# Patient Record
Sex: Female | Born: 1940
Health system: Southern US, Community
[De-identification: ages and names within clinical notes are randomized; demographics above are authoritative.]

## PROBLEM LIST (undated history)

## (undated) DIAGNOSIS — H409 Unspecified glaucoma: Secondary | ICD-10-CM

## (undated) DIAGNOSIS — E119 Type 2 diabetes mellitus without complications: Secondary | ICD-10-CM

## (undated) DIAGNOSIS — E039 Hypothyroidism, unspecified: Secondary | ICD-10-CM

## (undated) DIAGNOSIS — E079 Disorder of thyroid, unspecified: Secondary | ICD-10-CM

## (undated) DIAGNOSIS — J40 Bronchitis, not specified as acute or chronic: Secondary | ICD-10-CM

## (undated) DIAGNOSIS — J189 Pneumonia, unspecified organism: Secondary | ICD-10-CM

## (undated) DIAGNOSIS — I00 Rheumatic fever without heart involvement: Secondary | ICD-10-CM

## (undated) DIAGNOSIS — I10 Essential (primary) hypertension: Secondary | ICD-10-CM

## (undated) DIAGNOSIS — E785 Hyperlipidemia, unspecified: Secondary | ICD-10-CM

## (undated) DIAGNOSIS — D649 Anemia, unspecified: Secondary | ICD-10-CM

## (undated) DIAGNOSIS — N183 Chronic kidney disease, stage 3 unspecified: Secondary | ICD-10-CM

## (undated) DIAGNOSIS — E669 Obesity, unspecified: Secondary | ICD-10-CM

## (undated) HISTORY — DX: Essential (primary) hypertension: I10

## (undated) HISTORY — DX: Hyperlipidemia, unspecified: E78.5

## (undated) HISTORY — PX: ANKLE FRACTURE SURGERY: SHX122

## (undated) HISTORY — DX: Obesity, unspecified: E66.9

## (undated) HISTORY — DX: Type 2 diabetes mellitus without complications: E11.9

## (undated) HISTORY — DX: Unspecified glaucoma: H40.9

## (undated) HISTORY — DX: Chronic kidney disease, stage 3 unspecified: N18.30

## (undated) HISTORY — DX: Chronic kidney disease, stage 3 (moderate): N18.3

## (undated) HISTORY — PX: ABDOMINAL HYSTERECTOMY: SHX81

## (undated) HISTORY — DX: Disorder of thyroid, unspecified: E07.9

---

## 2004-12-05 HISTORY — PX: BACK SURGERY: SHX140

## 2005-09-23 ENCOUNTER — Ambulatory Visit (HOSPITAL_COMMUNITY): Admission: RE | Admit: 2005-09-23 | Discharge: 2005-09-23 | Payer: Self-pay | Admitting: Neurosurgery

## 2007-08-24 ENCOUNTER — Ambulatory Visit (HOSPITAL_COMMUNITY): Admission: RE | Admit: 2007-08-24 | Discharge: 2007-08-24 | Payer: Self-pay | Admitting: Family Medicine

## 2007-10-03 ENCOUNTER — Encounter: Admission: RE | Admit: 2007-10-03 | Discharge: 2007-10-03 | Payer: Self-pay | Admitting: Family Medicine

## 2009-02-26 ENCOUNTER — Ambulatory Visit (HOSPITAL_COMMUNITY): Admission: RE | Admit: 2009-02-26 | Discharge: 2009-02-26 | Payer: Self-pay | Admitting: Family Medicine

## 2011-04-22 NOTE — Op Note (Signed)
NAMEFLOELLA, ENSZ NO.:  [id number]   MEDICAL RECORD NO.:  [id number]          PATIENT TYPE:  AMB   LOCATION:  SDS                          FACILITY:  [facility]   PHYSICIAN:  Henry A. Pool, M.D.    DATE OF BIRTH:  [birth year]   DATE OF PROCEDURE:  09/23/2005  DATE OF DISCHARGE:                                 OPERATIVE REPORT   SERVICE:  Neurosurgery.   PREOPERATIVE DIAGNOSES:  Right L2-L3 herniated nucleus pulposus with  radiculopathy.   POSTOPERATIVE DIAGNOSES:  Right L2-L3 herniated nucleus pulposus with  radiculopathy.   PROCEDURE:  Right L2-L3 laminotomy and microdiskectomy.   SURGEON:  Kathaleen Maser. Pool, M.D.   ASSISTANT:  Donalee Citrin, M.D.   ANESTHESIA:  General endotracheal.   INDICATIONS FOR PROCEDURE:  Ms. Alamillo is a 70 year old female with a  history of back and right lower extremity pain consistent with a right sided  L3 radiculopathy.  Workup has demonstrated evidence of a right sided L2-L3  disc herniation with a large inferior fragment causing compression of the  thecal sac and right L3 nerve root.  The patient presents now for right  sided L2-L3 laminotomy and microdiscectomy in hopes of improving her  symptoms.   DESCRIPTION OF PROCEDURE:  The patient was brought to the operating room and  placed on the table in a supine position.  After an adequate level of  anesthesia was achieved, the patient was positioned prone on the Wilson  frame and properly padded.  The area of the lumbar region prepped and draped  sterilely.  A 10 blade was used to make a linear skin incision overlying the  L2-L3 interspace.  This was carried down sharply in the midline.  Subperiosteal dissection was performed exposing the lamina and facet joints  at L2-L3 on the right side.  Deep self-retaining retractors were placed.  X-  ray was taken and, in fact, the L3-L4 level had been exposed.  Dissection  was redirected one level cephalad.  Retractor was replaced.   A  laminotomy was then performed using the high speed drill and Kerrison  rongeurs to remove the inferior aspect of the lamina of L2, medial aspect of  the L2-L3 facet joint, and the superior rim of the L3 lamina.  The  ligamentum flavum was then elevated and resected in piecemeal fashion using  Kerrison rongeurs.  The underlying thecal sac and exiting L3 nerve root were  identified.  The microscope was brought onto the field and microdissection  of the right sided L3 nerve root and underlying disc herniation.  The  epidural venous plexus was coagulated and cut.  The thecal sac and L3 nerve  root were mobilized and retracted toward the midline.  The disc herniation  was readily apparent.  This was incised with a 15 blade.  The fragmented  disc material was then removed using blunt nerve hooks and pituitary  rongeurs.  The disc space was then incised with a 15 blade in rectangular  fashion.  Wide disc space clean out was then achieved using pituitary  rongeurs, upward angled pituitary rongeurs,  and Epstein curets.  After all  elements of disc herniation were completely resected, the canal was  inspected.  There was no evidence of injury to the thecal sac or nerve  roots.  The wound was then copiously irrigated with antibiotic solution,  Gelfoam was placed topically for hemostasis which was found to be good.  The  microscope and retractors were removed.  Hemostasis in  the muscle was achieved with electrocautery.  The wounds were closed in  layers with Vicryl sutures.  Steri-Strips and sterile dressing were applied.  There were no complications.  The patient tolerated the procedure well and  she was returned to the recovery room postoperatively.           ______________________________  Kathaleen Maser Pool, M.D.     HAP/MEDQ  D:  09/23/2005  T:  09/23/2005  Job:  [id number]

## 2011-08-12 ENCOUNTER — Other Ambulatory Visit (HOSPITAL_COMMUNITY): Payer: Self-pay | Admitting: Family Medicine

## 2011-08-12 DIAGNOSIS — Z1231 Encounter for screening mammogram for malignant neoplasm of breast: Secondary | ICD-10-CM

## 2011-08-15 ENCOUNTER — Ambulatory Visit (HOSPITAL_COMMUNITY)
Admission: RE | Admit: 2011-08-15 | Discharge: 2011-08-15 | Disposition: A | Discharge status: Home / Self Care | Payer: Medicare Other | Source: Ambulatory Visit | Attending: Family Medicine | Admitting: Family Medicine

## 2011-08-15 DIAGNOSIS — Z1231 Encounter for screening mammogram for malignant neoplasm of breast: Secondary | ICD-10-CM | POA: Insufficient documentation

## 2012-09-19 ENCOUNTER — Other Ambulatory Visit (HOSPITAL_COMMUNITY): Payer: Self-pay | Admitting: Family Medicine

## 2012-09-19 DIAGNOSIS — Z1231 Encounter for screening mammogram for malignant neoplasm of breast: Secondary | ICD-10-CM

## 2012-09-19 DIAGNOSIS — E559 Vitamin D deficiency, unspecified: Secondary | ICD-10-CM

## 2012-10-17 ENCOUNTER — Ambulatory Visit (HOSPITAL_COMMUNITY): Payer: Medicare Other

## 2012-11-02 ENCOUNTER — Ambulatory Visit (HOSPITAL_COMMUNITY)
Admission: RE | Admit: 2012-11-02 | Discharge: 2012-11-02 | Disposition: A | Discharge status: Home / Self Care | Payer: Medicare Other | Source: Ambulatory Visit | Attending: Family Medicine | Admitting: Family Medicine

## 2012-11-02 DIAGNOSIS — Z1231 Encounter for screening mammogram for malignant neoplasm of breast: Secondary | ICD-10-CM | POA: Insufficient documentation

## 2012-11-02 DIAGNOSIS — E559 Vitamin D deficiency, unspecified: Secondary | ICD-10-CM | POA: Insufficient documentation

## 2012-11-02 DIAGNOSIS — Z13820 Encounter for screening for osteoporosis: Secondary | ICD-10-CM | POA: Insufficient documentation

## 2013-10-14 ENCOUNTER — Other Ambulatory Visit (HOSPITAL_COMMUNITY): Payer: Self-pay | Admitting: Family Medicine

## 2013-10-14 DIAGNOSIS — Z1231 Encounter for screening mammogram for malignant neoplasm of breast: Secondary | ICD-10-CM

## 2013-11-18 ENCOUNTER — Ambulatory Visit (HOSPITAL_COMMUNITY): Payer: Medicare Other

## 2013-11-21 ENCOUNTER — Ambulatory Visit (HOSPITAL_COMMUNITY)
Admission: RE | Admit: 2013-11-21 | Discharge: 2013-11-21 | Disposition: A | Discharge status: Home / Self Care | Payer: Medicare Other | Source: Ambulatory Visit | Attending: Family Medicine | Admitting: Family Medicine

## 2013-11-21 DIAGNOSIS — Z1231 Encounter for screening mammogram for malignant neoplasm of breast: Secondary | ICD-10-CM

## 2013-12-17 ENCOUNTER — Other Ambulatory Visit: Payer: Self-pay | Admitting: Family Medicine

## 2013-12-17 DIAGNOSIS — R6881 Early satiety: Secondary | ICD-10-CM

## 2013-12-23 ENCOUNTER — Other Ambulatory Visit: Payer: Medicare Other

## 2013-12-25 ENCOUNTER — Ambulatory Visit
Admission: RE | Admit: 2013-12-25 | Discharge: 2013-12-25 | Disposition: A | Discharge status: Home / Self Care | Payer: Medicare Other | Source: Ambulatory Visit | Attending: Family Medicine | Admitting: Family Medicine

## 2013-12-25 DIAGNOSIS — R6881 Early satiety: Secondary | ICD-10-CM

## 2013-12-31 DIAGNOSIS — J40 Bronchitis, not specified as acute or chronic: Secondary | ICD-10-CM | POA: Insufficient documentation

## 2013-12-31 DIAGNOSIS — K8020 Calculus of gallbladder without cholecystitis without obstruction: Secondary | ICD-10-CM | POA: Insufficient documentation

## 2014-01-02 ENCOUNTER — Encounter (INDEPENDENT_AMBULATORY_CARE_PROVIDER_SITE_OTHER): Payer: Self-pay | Admitting: Surgery

## 2014-01-10 ENCOUNTER — Ambulatory Visit (INDEPENDENT_AMBULATORY_CARE_PROVIDER_SITE_OTHER): Payer: Medicare Other | Admitting: Surgery

## 2014-01-10 ENCOUNTER — Encounter (INDEPENDENT_AMBULATORY_CARE_PROVIDER_SITE_OTHER): Payer: Self-pay | Admitting: Surgery

## 2014-01-10 VITALS — BP 138/68 | HR 84 | Temp 97.3°F | Resp 15 | Ht 68.0 in | Wt 245.6 lb

## 2014-01-10 DIAGNOSIS — K8010 Calculus of gallbladder with chronic cholecystitis without obstruction: Secondary | ICD-10-CM | POA: Insufficient documentation

## 2014-01-10 NOTE — Progress Notes (Signed)
Patient ID: Claire Blake, female   DOB: 23-Aug-1941, 73 y.o.   MRN: [id number]  Chief Complaint  Patient presents with  . New Evaluation    eval Gallstones    HPI Claire Blake is a 73 y.o. female.  Referred by Dr. Cari Caraway  HPI This is a 73 year old female with hypertension type 2 diabetes hyperlipidemia hypothyroidism obesity and stage III chronic kidney disease that presents with recent development of decreased appetite.  The patient states that, ever since Christmas, she has not had much appetite. When she tries to eat, she gets some mild nausea.  She also reports significant abdominal bloating associated with belching and flatulence. She denies any change in her bowel habits. She does not really have any significant abdominal pain. She did have one episode of generalized abdominal pain after eating.  She presented to Dr. Addison Lank with the symptoms. Her workup included an ultrasound as well as liver function tests and lipase. Her blood work was normal but her ultrasound showed multiple layering gallstones. She is now referred for surgical evaluation for cholecystectomy. Past Medical History  Diagnosis Date  . Diabetes mellitus without complication   . Hypertension   . Hyperlipidemia   . Thyroid disease   . Obesity   . CKD (chronic kidney disease), stage III   . Glaucoma     Past Surgical History  Procedure Laterality Date  . Back surgery  2006    ruptured disc  . Abdominal hysterectomy      partial  . Ankle fracture surgery      Family History  Problem Relation Age of Onset  . COPD Mother   . Cancer Father     throat  . Cancer Maternal Grandmother     bilateral breast    Social History History  Substance Use Topics  . Smoking status: Never Smoker   . Smokeless tobacco: Never Used  . Alcohol Use: No    Allergies  Allergen Reactions  . Zocor [Simvastatin]     Leg cramps    Current Outpatient Prescriptions  Medication Sig Dispense Refill  . aspirin 81  MG tablet Take 81 mg by mouth daily.      . Biotin 300 MCG TABS Take 400 mcg by mouth.      . Blood Glucose Monitoring Suppl (ONE TOUCH ULTRA SYSTEM KIT) W/DEVICE KIT 1 kit by Does not apply route once.      . Calcium 600-200 MG-UNIT per tablet Take 1 tablet by mouth daily.      . cefUROXime (CEFTIN) 500 MG tablet       . Cholecalciferol (VITAMIN D3) 3000 UNITS TABS Take by mouth.      Marland Kitchen glimepiride (AMARYL) 4 MG tablet Take 4 mg by mouth daily with breakfast.      . hydrochlorothiazide (MICROZIDE) 12.5 MG capsule Take 12.5 mg by mouth daily.      Marland Kitchen levothyroxine (SYNTHROID, LEVOTHROID) 88 MCG tablet Take 88 mcg by mouth daily before breakfast.      . lisinopril (PRINIVIL,ZESTRIL) 40 MG tablet Take 40 mg by mouth daily.      Marland Kitchen lovastatin (MEVACOR) 40 MG tablet Take 40 mg by mouth at bedtime.      . metFORMIN (GLUCOPHAGE) 1000 MG tablet Take 1,000 mg by mouth 2 (two) times daily with a meal.      . Multiple Vitamin (MULTIVITAMIN WITH MINERALS) TABS tablet Take 1 tablet by mouth daily.      . pioglitazone (ACTOS) 45 MG tablet  Take 45 mg by mouth daily.       No current facility-administered medications for this visit.    Review of Systems Review of Systems  Constitutional: Negative for fever, chills and unexpected weight change.  HENT: Negative for congestion, hearing loss, sore throat, trouble swallowing and voice change.   Eyes: Negative for visual disturbance.  Respiratory: Negative for cough and wheezing.   Cardiovascular: Negative for chest pain, palpitations and leg swelling.  Gastrointestinal: Positive for nausea and abdominal distention. Negative for vomiting, abdominal pain, diarrhea, constipation, blood in stool and anal bleeding.  Genitourinary: Negative for hematuria, vaginal bleeding and difficulty urinating.  Musculoskeletal: Negative for arthralgias.  Skin: Negative for rash and wound.  Neurological: Negative for seizures, syncope and headaches.  Hematological: Negative for  adenopathy. Does not bruise/bleed easily.  Psychiatric/Behavioral: Negative for confusion.    Blood pressure 138/68, pulse 84, temperature 97.3 F (36.3 C), temperature source Oral, resp. rate 15, height '5\' 8"'  (1.727 m), weight 245 lb 9.6 oz (111.403 kg).  Physical Exam Physical Exam WDWN in NAD HEENT:  EOMI, sclera anicteric Neck:  No masses, no thyromegaly Lungs:  CTA bilaterally; normal respiratory effort CV:  Regular rate and rhythm; no murmurs Abd:  +bowel sounds, soft, non-tender, no masses Ext:  Well-perfused; no edema Skin:  Warm, dry; no sign of jaundice  Data Reviewed US Abdomen Complete  12/25/2013   CLINICAL DATA:  Early satiety.  Diabetes  EXAM: ULTRASOUND ABDOMEN COMPLETE  COMPARISON:  None.  FINDINGS: Gallbladder:  Multiple small layering gallstones without gallbladder wall thickening. Negative sonographic Murphy sign.  Common bile duct:  Diameter: 3.0 mm  Liver:  Echogenic liver without focal lesion.  IVC:  Limited  Pancreas:  Limited  Spleen:  Size and appearance within normal limits.  Right Kidney:  Length: 10.0 cm in length. Echogenicity within normal limits. No mass or hydronephrosis visualized.  Left Kidney:  Length: 12.3 cm in length. Echogenicity within normal limits. No mass or hydronephrosis visualized.  Abdominal aorta:  No aneurysm visualized.  Other findings:  Limited by bowel gas  IMPRESSION: Small layering gallstones without biliary obstruction.  Fatty infiltration of the liver.   Electronically Signed   By: Franchot Gallo M.D.   On: 12/25/2013 11:30    12/17/13 Tbili 0.4 Alk Phos 54 AST 13 ALT 11 Lipase 31   Assessment    Chronic calculus cholecystitis which is likely causing her symptoms.       Plan    Laparoscopic cholecystectomy with intraoperative cholangiogram. The surgical procedure has been discussed with the patient.  Potential risks, benefits, alternative treatments, and expected outcomes have been explained.  All of the patient's  questions at this time have been answered.  The likelihood of reaching the patient's treatment goal is good.  The patient understand the proposed surgical procedure and wishes to proceed.         Paulmichael Schreck K. 01/10/2014, 11:30 AM

## 2014-01-13 NOTE — Pre-Procedure Instructions (Signed)
Remi DeterMaxine M Gionfriddo  01/13/2014   Your procedure is scheduled on:  Thursday, Feb. 12th   [facility]  2 * 3 at  7:00 AM.  Call this number if you have problems the morning of surgery: [phone]   Remember:   Do not eat food or drink liquids after midnight Wednesday.   Take these medicines the morning of surgery with A SIP OF WATER: Levothyroxine   Do not wear jewelry, make-up or nail polish.  Do not wear lotions, powders, or perfumes. You may wear deodorant.  Do not shave underarms & legs 48 hours prior to surgery.   Do not bring valuables to the hospital.  [facility] is not responsible  for any belongings or valuables.               Contacts, dentures or bridgework may not be worn into surgery.  Leave suitcase in the car. After surgery it may be brought to your room.              Patients discharged the day of surgery will not be allowed to drive home.   Name and phone number of your driver:    Special Instructions: [place] - Preparing for Surgery  Before surgery, you can play an important role.  Because skin is not sterile, your skin needs to be as free of germs as possible.  You can reduce the number of germs on you skin by washing with CHG (chlorahexidine gluconate) soap before surgery.  CHG is an antiseptic cleaner which kills germs and bonds with the skin to continue killing germs even after washing.  Please DO NOT use if you have an allergy to CHG or antibacterial soaps.  If your skin becomes reddened/irritated stop using the CHG and inform your nurse when you arrive at Short Stay.  Do not shave (including legs and underarms) for at least 48 hours prior to the first CHG shower.  You may shave your face.  Please follow these instructions carefully:   1.  Shower with CHG Soap the night before surgery and the morning of Surgery.  2.  If you choose to wash your hair, wash your hair first as usual with your normal shampoo.  3.  After you  shampoo, rinse your hair and body thoroughly to remove the Shampoo.  4.  Use CHG as you would any other liquid soap.  You can apply chg directly to the skin and wash gently with scrungie or a clean washcloth.  5.  Apply the CHG Soap to your body ONLY FROM THE NECK DOWN.        Do not use on open wounds or open sores.  Avoid contact with your eyes, ears, mouth and genitals (private parts).  Wash genitals (private parts) with your normal soap.  6.  Wash thoroughly, paying special attention to the area where your surgery will be performed.  7.  Thoroughly rinse your body with warm water from the neck down.  8.  DO NOT shower/wash with your normal soap after using and rinsing off the CHG Soap.  9.  Pat yourself dry with a clean towel.            10.  Wear clean pajamas.            11.  Place clean sheets on your bed the night of your first shower and do not sleep with pets.  Day of Surgery  Do not apply  any lotions/deoderants the morning of surgery.  Please wear clean clothes to the hospital/surgery center.     Please read over the following fact sheets that you were given: Pain Booklet and Surgical Site Infection Prevention

## 2014-01-14 ENCOUNTER — Ambulatory Visit (HOSPITAL_COMMUNITY)
Admission: RE | Admit: 2014-01-14 | Discharge: 2014-01-14 | Disposition: A | Discharge status: Home / Self Care | Payer: Medicare Other | Source: Ambulatory Visit | Attending: Anesthesiology | Admitting: Anesthesiology

## 2014-01-14 ENCOUNTER — Other Ambulatory Visit (INDEPENDENT_AMBULATORY_CARE_PROVIDER_SITE_OTHER): Payer: Self-pay | Admitting: Surgery

## 2014-01-14 ENCOUNTER — Encounter (HOSPITAL_COMMUNITY): Payer: Self-pay

## 2014-01-14 ENCOUNTER — Encounter (HOSPITAL_COMMUNITY)
Admission: RE | Admit: 2014-01-14 | Discharge: 2014-01-14 | Disposition: A | Discharge status: Home / Self Care | Payer: Medicare Other | Source: Ambulatory Visit | Attending: Surgery | Admitting: Surgery

## 2014-01-14 DIAGNOSIS — I10 Essential (primary) hypertension: Secondary | ICD-10-CM | POA: Insufficient documentation

## 2014-01-14 HISTORY — DX: Pneumonia, unspecified organism: J18.9

## 2014-01-14 HISTORY — DX: Bronchitis, not specified as acute or chronic: J40

## 2014-01-14 HISTORY — DX: Anemia, unspecified: D64.9

## 2014-01-14 HISTORY — DX: Hypothyroidism, unspecified: E03.9

## 2014-01-14 HISTORY — DX: Rheumatic fever without heart involvement: I00

## 2014-01-14 LAB — CBC
HCT: 26.2 % — ABNORMAL LOW (ref 36.0–46.0)
Hemoglobin: 8.5 g/dL — ABNORMAL LOW (ref 12.0–15.0)
MCH: 27.7 pg (ref 26.0–34.0)
MCHC: 32.4 g/dL (ref 30.0–36.0)
MCV: 85.3 fL (ref 78.0–100.0)
Platelets: 540 10*3/uL — ABNORMAL HIGH (ref 150–400)
RBC: 3.07 MIL/uL — ABNORMAL LOW (ref 3.87–5.11)
RDW: 14.9 % (ref 11.5–15.5)
WBC: 11.2 10*3/uL — ABNORMAL HIGH (ref 4.0–10.5)

## 2014-01-14 LAB — BASIC METABOLIC PANEL
BUN: 41 mg/dL — AB (ref 6–23)
CALCIUM: 9.6 mg/dL (ref 8.4–10.5)
CO2: 25 mEq/L (ref 19–32)
CREATININE: 1.21 mg/dL — AB (ref 0.50–1.10)
Chloride: 92 mEq/L — ABNORMAL LOW (ref 96–112)
GFR calc Af Amer: 51 mL/min — ABNORMAL LOW (ref >90)
GFR, EST NON AFRICAN AMERICAN: 44 mL/min — AB (ref >90)
GLUCOSE: 175 mg/dL — AB (ref 70–99)
Potassium: 4.7 mEq/L (ref 3.7–5.3)
Sodium: 133 mEq/L — ABNORMAL LOW (ref 137–147)

## 2014-01-14 MED ORDER — ONDANSETRON 4 MG PO TBDP: 4.0000 mg | ORAL_TABLET | Freq: Three times a day (TID) | ORAL | Status: DC | PRN

## 2014-01-14 NOTE — Pre-Procedure Instructions (Addendum)
Remi DeterMaxine M Creveling  01/14/2014   Your procedure is scheduled on:  01/16/14  [facility] at 700 AM.  Call this number if you have problems the morning of surgery: [phone]   Remember:   Do not eat food or drink liquids after midnight.   Take these medicines the morning of surgery with A SIP OF WATER: levothyroxine       STOP all herbel meds, nsaids (aleve,naproxen,advil,ibuprofen) now including aspirin, biotin, vitamins        NO DIABETIC MEDS AM OF SURGERY   Do not wear jewelry, make-up or nail polish.  Do not wear lotions, powders, or perfumes. You may wear deodorant.  Do not shave 48 hours prior to surgery. Men may shave face and neck.  Do not bring valuables to the hospital.  [facility] is not responsible                  for any belongings or valuables.               Contacts, dentures or bridgework may not be worn into surgery.  Leave suitcase in the car. After surgery it may be brought to your room.  For patients admitted to the hospital, discharge time is determined by your                treatment team.               Patients discharged the day of surgery will not be allowed to drive  home.  Name and phone number of your driver:  Special Instructions:  Special Instructions: [place] - Preparing for Surgery  Before surgery, you can play an important role.  Because skin is not sterile, your skin needs to be as free of germs as possible.  You can reduce the number of germs on you skin by washing with CHG (chlorahexidine gluconate) soap before surgery.  CHG is an antiseptic cleaner which kills germs and bonds with the skin to continue killing germs even after washing.  Please DO NOT use if you have an allergy to CHG or antibacterial soaps.  If your skin becomes reddened/irritated stop using the CHG and inform your nurse when you arrive at Short Stay.  Do not shave (including legs and underarms) for at least 48 hours prior to the first CHG shower.   You may shave your face.  Please follow these instructions carefully:   1.  Shower with CHG Soap the night before surgery and the morning of Surgery.  2.  If you choose to wash your hair, wash your hair first as usual with your normal shampoo.  3.  After you shampoo, rinse your hair and body thoroughly to remove the Shampoo.  4.  Use CHG as you would any other liquid soap.  You can apply chg directly  to the skin and wash gently with scrungie or a clean washcloth.  5.  Apply the CHG Soap to your body ONLY FROM THE NECK DOWN.  Do not use on open wounds or open sores.  Avoid contact with your eyes ears, mouth and genitals (private parts).  Wash genitals (private parts)       with your normal soap.  6.  Wash thoroughly, paying special attention to the area where your surgery will be performed.  7.  Thoroughly rinse your body with warm water from the neck down.  8.  DO NOT shower/wash with your normal soap after using  and rinsing off the CHG Soap.  9.  Pat yourself dry with a clean towel.            10.  Wear clean pajamas.            11.  Place clean sheets on your bed the night of your first shower and do not sleep with pets.  Day of Surgery  Do not apply any lotions/deodorants the morning of surgery.  Please wear clean clothes to the hospital/surgery center.   Please read over the following fact sheets that you were given: Pain Booklet, Coughing and Deep Breathing and Surgical Site Infection Prevention

## 2014-01-14 NOTE — Progress Notes (Addendum)
Anesthesia Chart Review:  Patient is a 73 year old female scheduled for laparoscopic cholecystectomy on 01/16/14 by Dr. Corliss Skainssuei.  History includes non-smoker, DM2, HTN, HLD, CKD stage III, hypothyroidism, glaucoma, Rheumatic fever, anemia, hysterectomy, L2-3 microdiskectomy '12.  BMI is 36.82 (obesity). PCP is Dr. Selena BattenWendy McNeil.  EKG on 01/14/14 showed NSR, low voltage QRS, cannot rule out anterior infarct (age undetermined). Isolated Q wave in lead III is new, otherwise I think her EKG is stable when compared to prior EKG on 09/19/05 (see Muse).  CXR on 01/14/14 showed no active cardiopulmonary disease.  Preoperative labs showed BUN/Cr 41/1.21.  WBC 11.2.  H/H 8.5/26.2, PLT 540K. Labs are already marked as reviewed by Dr. Corliss Skainssuei in Epic. I will defer decision for T&S/T&C to the surgeon.  Records with comparison labs have also been requested from her PCP, but these are still pending.  Claire Ochsllison [name], PA-C [facility]/Anesthesiology Phone [phone] 01/15/2014 10:32 AM  Addendum: 01/16/2014 3:40 PM I reviewed records from Dr. Uvaldo RisingMcNeil.  She was last seen 12/31/13.  Her last CBC was on 06/20/13 and H/H were 12.1/36.0 then.  According to Dr. Fatima Sangersuei's note from last week, she denied any blood in stool or anal bleeding. I did send Dr. Corliss Skainssuei a staff message with prior lab results for his review so he can instruct patient regarding further anemia follow-up.

## 2014-01-14 NOTE — Progress Notes (Signed)
01/14/14 1457  OBSTRUCTIVE SLEEP APNEA  Have you ever been diagnosed with sleep apnea through a sleep study? No  Do you snore loudly (loud enough to be heard through closed doors)?  0  Do you often feel tired, fatigued, or sleepy during the daytime? 0  Has anyone observed you stop breathing during your sleep? 0  Do you have, or are you being treated for high blood pressure? 1  BMI more than 35 kg/m2? 1  Age over 73 years old? 1  Neck circumference greater than 40 cm/18 inches? 1 (19)  Gender: 0  Obstructive Sleep Apnea Score 4  Score 4 or greater  Results sent to PCP

## 2014-01-14 NOTE — Progress Notes (Signed)
Patient extremely nauseated for past few days. Unable to eat. Called dr Corliss Skainstsuei office to call in something for nausea. He stated he would call her something in to her pharmacy.

## 2014-01-14 NOTE — Progress Notes (Signed)
req'd ekg, office notes [facility] [phone]

## 2014-01-15 MED ORDER — CEFAZOLIN SODIUM-DEXTROSE 2-3 GM-% IV SOLR
2.0000 g | INTRAVENOUS | Status: AC
  Administered 2014-01-16: 2 g via INTRAVENOUS
  Filled 2014-01-15: qty 50

## 2014-01-15 MED ORDER — CHLORHEXIDINE GLUCONATE 4 % EX LIQD
1.0000 "application " | Freq: Once | CUTANEOUS | Status: DC
  Filled 2014-01-15: qty 15

## 2014-01-16 ENCOUNTER — Encounter (HOSPITAL_COMMUNITY)
Admission: RE | Disposition: A | Discharge status: Home / Self Care | Payer: Self-pay | Source: Ambulatory Visit | Attending: Surgery

## 2014-01-16 ENCOUNTER — Ambulatory Visit (HOSPITAL_COMMUNITY): Payer: Medicare Other | Admitting: Anesthesiology

## 2014-01-16 ENCOUNTER — Inpatient Hospital Stay (HOSPITAL_COMMUNITY)
Admission: RE | Admit: 2014-01-16 | Discharge: 2014-01-18 | DRG: 417 | Disposition: A | Discharge status: Home / Self Care | Payer: Medicare Other | Source: Ambulatory Visit | Attending: Surgery | Admitting: Surgery

## 2014-01-16 ENCOUNTER — Ambulatory Visit (HOSPITAL_COMMUNITY): Payer: Medicare Other

## 2014-01-16 ENCOUNTER — Encounter (HOSPITAL_COMMUNITY): Payer: Self-pay | Admitting: *Deleted

## 2014-01-16 ENCOUNTER — Encounter (HOSPITAL_COMMUNITY): Payer: Medicare Other | Admitting: Vascular Surgery

## 2014-01-16 DIAGNOSIS — N183 Chronic kidney disease, stage 3 unspecified: Secondary | ICD-10-CM | POA: Diagnosis present

## 2014-01-16 DIAGNOSIS — K8010 Calculus of gallbladder with chronic cholecystitis without obstruction: Secondary | ICD-10-CM

## 2014-01-16 DIAGNOSIS — Z7982 Long term (current) use of aspirin: Secondary | ICD-10-CM

## 2014-01-16 DIAGNOSIS — Y849 Medical procedure, unspecified as the cause of abnormal reaction of the patient, or of later complication, without mention of misadventure at the time of the procedure: Secondary | ICD-10-CM | POA: Diagnosis not present

## 2014-01-16 DIAGNOSIS — E039 Hypothyroidism, unspecified: Secondary | ICD-10-CM | POA: Diagnosis present

## 2014-01-16 DIAGNOSIS — E785 Hyperlipidemia, unspecified: Secondary | ICD-10-CM | POA: Diagnosis present

## 2014-01-16 DIAGNOSIS — IMO0002 Reserved for concepts with insufficient information to code with codable children: Secondary | ICD-10-CM | POA: Diagnosis not present

## 2014-01-16 DIAGNOSIS — E669 Obesity, unspecified: Secondary | ICD-10-CM | POA: Diagnosis present

## 2014-01-16 DIAGNOSIS — K226 Gastro-esophageal laceration-hemorrhage syndrome: Secondary | ICD-10-CM | POA: Diagnosis not present

## 2014-01-16 DIAGNOSIS — D649 Anemia, unspecified: Secondary | ICD-10-CM | POA: Diagnosis present

## 2014-01-16 DIAGNOSIS — E1169 Type 2 diabetes mellitus with other specified complication: Secondary | ICD-10-CM | POA: Diagnosis present

## 2014-01-16 DIAGNOSIS — K811 Chronic cholecystitis: Secondary | ICD-10-CM | POA: Diagnosis present

## 2014-01-16 DIAGNOSIS — I129 Hypertensive chronic kidney disease with stage 1 through stage 4 chronic kidney disease, or unspecified chronic kidney disease: Secondary | ICD-10-CM | POA: Diagnosis present

## 2014-01-16 HISTORY — PX: ESOPHAGOGASTRODUODENOSCOPY: SHX5428

## 2014-01-16 HISTORY — PX: CHOLECYSTECTOMY: SHX55

## 2014-01-16 LAB — GLUCOSE, CAPILLARY
GLUCOSE-CAPILLARY: 216 mg/dL — AB (ref 70–99)
GLUCOSE-CAPILLARY: 217 mg/dL — AB (ref 70–99)
Glucose-Capillary: 217 mg/dL — ABNORMAL HIGH (ref 70–99)
Glucose-Capillary: 147 mg/dL — ABNORMAL HIGH (ref 70–99)
Glucose-Capillary: 120 mg/dL — ABNORMAL HIGH (ref 70–99)

## 2014-01-16 SURGERY — EGD (ESOPHAGOGASTRODUODENOSCOPY)
Anesthesia: Moderate Sedation

## 2014-01-16 SURGERY — LAPAROSCOPIC CHOLECYSTECTOMY WITH INTRAOPERATIVE CHOLANGIOGRAM
Anesthesia: General | Site: Abdomen

## 2014-01-16 MED ORDER — LISINOPRIL 40 MG PO TABS
40.0000 mg | ORAL_TABLET | Freq: Every day | ORAL | Status: DC
  Administered 2014-01-16 – 2014-01-18 (×2): 40 mg via ORAL
  Filled 2014-01-16 (×3): qty 1

## 2014-01-16 MED ORDER — FENTANYL CITRATE 0.05 MG/ML IJ SOLN
INTRAMUSCULAR | Status: AC
  Filled 2014-01-16: qty 5

## 2014-01-16 MED ORDER — PHENYLEPHRINE 40 MCG/ML (10ML) SYRINGE FOR IV PUSH (FOR BLOOD PRESSURE SUPPORT)
PREFILLED_SYRINGE | INTRAVENOUS | Status: AC
  Filled 2014-01-16: qty 10

## 2014-01-16 MED ORDER — INSULIN ASPART 100 UNIT/ML ~~LOC~~ SOLN
0.0000 [IU] | SUBCUTANEOUS | Status: DC
  Administered 2014-01-16: 5 [IU] via SUBCUTANEOUS
  Administered 2014-01-16: 2 [IU] via SUBCUTANEOUS

## 2014-01-16 MED ORDER — MIDAZOLAM HCL 5 MG/ML IJ SOLN
INTRAMUSCULAR | Status: AC
  Filled 2014-01-16: qty 2

## 2014-01-16 MED ORDER — OXYCODONE-ACETAMINOPHEN 5-325 MG PO TABS
1.0000 | ORAL_TABLET | ORAL | Status: DC | PRN
  Administered 2014-01-17 (×3): 1 via ORAL
  Filled 2014-01-16 (×3): qty 1

## 2014-01-16 MED ORDER — PHENYLEPHRINE HCL 10 MG/ML IJ SOLN
INTRAMUSCULAR | Status: DC | PRN
  Administered 2014-01-16: 80 ug via INTRAVENOUS
  Administered 2014-01-16 (×2): 160 ug via INTRAVENOUS

## 2014-01-16 MED ORDER — EPHEDRINE SULFATE 50 MG/ML IJ SOLN
INTRAMUSCULAR | Status: AC
  Filled 2014-01-16: qty 1

## 2014-01-16 MED ORDER — ONDANSETRON HCL 4 MG PO TABS: 4.0000 mg | ORAL_TABLET | Freq: Four times a day (QID) | ORAL | Status: DC | PRN

## 2014-01-16 MED ORDER — ONDANSETRON HCL 4 MG/2ML IJ SOLN
INTRAMUSCULAR | Status: AC
  Filled 2014-01-16: qty 2

## 2014-01-16 MED ORDER — NEOSTIGMINE METHYLSULFATE 1 MG/ML IJ SOLN
INTRAMUSCULAR | Status: AC
  Filled 2014-01-16: qty 10

## 2014-01-16 MED ORDER — LIDOCAINE HCL (CARDIAC) 20 MG/ML IV SOLN
INTRAVENOUS | Status: DC | PRN
  Administered 2014-01-16: 80 mg via INTRAVENOUS

## 2014-01-16 MED ORDER — LACTATED RINGERS IV SOLN
INTRAVENOUS | Status: DC | PRN
  Administered 2014-01-16: 09:00:00 via INTRAVENOUS

## 2014-01-16 MED ORDER — NEOSTIGMINE METHYLSULFATE 1 MG/ML IJ SOLN
INTRAMUSCULAR | Status: DC | PRN
  Administered 2014-01-16: 4 mg via INTRAVENOUS
  Administered 2014-01-16: 1 mg via INTRAVENOUS

## 2014-01-16 MED ORDER — DIPHENHYDRAMINE HCL 50 MG/ML IJ SOLN
INTRAMUSCULAR | Status: AC
  Filled 2014-01-16: qty 1

## 2014-01-16 MED ORDER — PROPOFOL 10 MG/ML IV BOLUS
INTRAVENOUS | Status: AC
  Filled 2014-01-16: qty 20

## 2014-01-16 MED ORDER — ONDANSETRON HCL 4 MG/2ML IJ SOLN: 4.0000 mg | Freq: Four times a day (QID) | INTRAMUSCULAR | Status: DC | PRN

## 2014-01-16 MED ORDER — SUCCINYLCHOLINE CHLORIDE 20 MG/ML IJ SOLN
INTRAMUSCULAR | Status: AC
  Filled 2014-01-16: qty 1

## 2014-01-16 MED ORDER — OXYCODONE HCL 5 MG/5ML PO SOLN: 5.0000 mg | Freq: Once | ORAL | Status: DC | PRN

## 2014-01-16 MED ORDER — 0.9 % SODIUM CHLORIDE (POUR BTL) OPTIME
TOPICAL | Status: DC | PRN
  Administered 2014-01-16: 1000 mL

## 2014-01-16 MED ORDER — FENTANYL CITRATE 0.05 MG/ML IJ SOLN
INTRAMUSCULAR | Status: DC | PRN
  Administered 2014-01-16: 100 ug via INTRAVENOUS

## 2014-01-16 MED ORDER — ALBUMIN HUMAN 5 % IV SOLN
INTRAVENOUS | Status: DC | PRN
  Administered 2014-01-16: 09:00:00 via INTRAVENOUS

## 2014-01-16 MED ORDER — MIDAZOLAM HCL 10 MG/2ML IJ SOLN
INTRAMUSCULAR | Status: DC | PRN
  Administered 2014-01-16 (×3): 1 mg via INTRAVENOUS
  Administered 2014-01-16: 2 mg via INTRAVENOUS
  Administered 2014-01-16: 1 mg via INTRAVENOUS

## 2014-01-16 MED ORDER — FENTANYL CITRATE 0.05 MG/ML IJ SOLN
INTRAMUSCULAR | Status: AC
  Filled 2014-01-16: qty 2

## 2014-01-16 MED ORDER — BUPIVACAINE-EPINEPHRINE (PF) 0.25% -1:200000 IJ SOLN
INTRAMUSCULAR | Status: AC
  Filled 2014-01-16: qty 30

## 2014-01-16 MED ORDER — ONDANSETRON HCL 4 MG/2ML IJ SOLN
INTRAMUSCULAR | Status: DC | PRN
Start: 2014-01-16 — End: 2014-01-16
  Administered 2014-01-16: 4 mg via INTRAVENOUS

## 2014-01-16 MED ORDER — INSULIN ASPART 100 UNIT/ML ~~LOC~~ SOLN
4.0000 [IU] | Freq: Three times a day (TID) | SUBCUTANEOUS | Status: DC
Start: 2014-01-17 — End: 2014-01-18
  Administered 2014-01-17 – 2014-01-18 (×3): 4 [IU] via SUBCUTANEOUS

## 2014-01-16 MED ORDER — GLYCOPYRROLATE 0.2 MG/ML IJ SOLN
INTRAMUSCULAR | Status: AC
  Filled 2014-01-16: qty 3

## 2014-01-16 MED ORDER — FENTANYL CITRATE 0.05 MG/ML IJ SOLN: 25.0000 ug | INTRAMUSCULAR | Status: DC | PRN

## 2014-01-16 MED ORDER — PROPOFOL 10 MG/ML IV BOLUS
INTRAVENOUS | Status: DC | PRN
  Administered 2014-01-16: 200 mg via INTRAVENOUS

## 2014-01-16 MED ORDER — PANTOPRAZOLE SODIUM 40 MG IV SOLR
40.0000 mg | Freq: Two times a day (BID) | INTRAVENOUS | Status: DC
  Administered 2014-01-16 – 2014-01-18 (×4): 40 mg via INTRAVENOUS
  Filled 2014-01-16 (×6): qty 40

## 2014-01-16 MED ORDER — SODIUM CHLORIDE 0.9 % IV SOLN
INTRAVENOUS | Status: DC | PRN
  Administered 2014-01-16: 09:00:00

## 2014-01-16 MED ORDER — ROCURONIUM BROMIDE 50 MG/5ML IV SOLN
INTRAVENOUS | Status: AC
  Filled 2014-01-16: qty 1

## 2014-01-16 MED ORDER — MORPHINE SULFATE 2 MG/ML IJ SOLN
2.0000 mg | INTRAMUSCULAR | Status: DC | PRN
  Administered 2014-01-16 (×2): 4 mg via INTRAVENOUS
  Administered 2014-01-17: 2 mg via INTRAVENOUS
  Filled 2014-01-16: qty 1
  Filled 2014-01-16 (×2): qty 2

## 2014-01-16 MED ORDER — SODIUM CHLORIDE 0.9 % IV SOLN
INTRAVENOUS | Status: DC
  Administered 2014-01-16 – 2014-01-17 (×3): via INTRAVENOUS

## 2014-01-16 MED ORDER — LIDOCAINE HCL (CARDIAC) 20 MG/ML IV SOLN
INTRAVENOUS | Status: AC
  Filled 2014-01-16: qty 5

## 2014-01-16 MED ORDER — OXYCODONE HCL 5 MG PO TABS: 5.0000 mg | ORAL_TABLET | Freq: Once | ORAL | Status: DC | PRN

## 2014-01-16 MED ORDER — HYDROCHLOROTHIAZIDE 12.5 MG PO CAPS
12.5000 mg | ORAL_CAPSULE | Freq: Every day | ORAL | Status: DC
  Administered 2014-01-16 – 2014-01-18 (×2): 12.5 mg via ORAL
  Filled 2014-01-16 (×3): qty 1

## 2014-01-16 MED ORDER — SODIUM CHLORIDE 0.9 % IV SOLN: INTRAVENOUS | Status: DC

## 2014-01-16 MED ORDER — SODIUM CHLORIDE 0.9 % IR SOLN
Status: DC | PRN
  Administered 2014-01-16: 1000 mL

## 2014-01-16 MED ORDER — SODIUM CHLORIDE 0.9 % IV SOLN
INTRAVENOUS | Status: DC
  Administered 2014-01-16: 08:00:00 via INTRAVENOUS

## 2014-01-16 MED ORDER — SODIUM CHLORIDE 0.9 % IJ SOLN
INTRAMUSCULAR | Status: AC
  Filled 2014-01-16: qty 10

## 2014-01-16 MED ORDER — SUCCINYLCHOLINE CHLORIDE 20 MG/ML IJ SOLN
INTRAMUSCULAR | Status: DC | PRN
  Administered 2014-01-16: 100 mg via INTRAVENOUS

## 2014-01-16 MED ORDER — LEVOTHYROXINE SODIUM 88 MCG PO TABS
88.0000 ug | ORAL_TABLET | Freq: Every day | ORAL | Status: DC
  Administered 2014-01-16 – 2014-01-18 (×3): 88 ug via ORAL
  Filled 2014-01-16 (×4): qty 1

## 2014-01-16 MED ORDER — ROCURONIUM BROMIDE 100 MG/10ML IV SOLN
INTRAVENOUS | Status: DC | PRN
  Administered 2014-01-16: 40 mg via INTRAVENOUS

## 2014-01-16 MED ORDER — INSULIN ASPART 100 UNIT/ML ~~LOC~~ SOLN
0.0000 [IU] | Freq: Three times a day (TID) | SUBCUTANEOUS | Status: DC
  Administered 2014-01-17 – 2014-01-18 (×3): 3 [IU] via SUBCUTANEOUS

## 2014-01-16 MED ORDER — BUPIVACAINE-EPINEPHRINE 0.25% -1:200000 IJ SOLN
INTRAMUSCULAR | Status: DC | PRN
  Administered 2014-01-16: 14 mL

## 2014-01-16 MED ORDER — GLYCOPYRROLATE 0.2 MG/ML IJ SOLN
INTRAMUSCULAR | Status: DC | PRN
  Administered 2014-01-16: 0.6 mg via INTRAVENOUS

## 2014-01-16 MED ORDER — FENTANYL CITRATE 0.05 MG/ML IJ SOLN
INTRAMUSCULAR | Status: DC | PRN
  Administered 2014-01-16: 25 ug via INTRAVENOUS

## 2014-01-16 SURGICAL SUPPLY — 50 items
APL SKNCLS STERI-STRIP NONHPOA (GAUZE/BANDAGES/DRESSINGS) ×1
APPLIER CLIP ROT 10 11.4 M/L (STAPLE) ×3
APR CLP MED LRG 11.4X10 (STAPLE) ×1
BAG SPEC RTRVL LRG 6X4 10 (ENDOMECHANICALS) ×1
BENZOIN TINCTURE PRP APPL 2/3 (GAUZE/BANDAGES/DRESSINGS) ×3 IMPLANT
BLADE SURG ROTATE 9660 (MISCELLANEOUS) IMPLANT
CANISTER SUCTION 2500CC (MISCELLANEOUS) ×3 IMPLANT
CHLORAPREP W/TINT 26ML (MISCELLANEOUS) ×3 IMPLANT
CLIP APPLIE ROT 10 11.4 M/L (STAPLE) ×1 IMPLANT
COVER MAYO STAND STRL (DRAPES) ×3 IMPLANT
COVER SURGICAL LIGHT HANDLE (MISCELLANEOUS) ×3 IMPLANT
DRAPE C-ARM 42X72 X-RAY (DRAPES) ×3 IMPLANT
DRAPE UTILITY 15X26 W/TAPE STR (DRAPE) ×6 IMPLANT
DRSG TEGADERM 2-3/8X2-3/4 SM (GAUZE/BANDAGES/DRESSINGS) ×9 IMPLANT
DRSG TEGADERM 4X4.75 (GAUZE/BANDAGES/DRESSINGS) ×3 IMPLANT
ELECT REM PT RETURN 9FT ADLT (ELECTROSURGICAL) ×3
ELECTRODE REM PT RTRN 9FT ADLT (ELECTROSURGICAL) ×1 IMPLANT
FILTER SMOKE EVAC LAPAROSHD (FILTER) ×3 IMPLANT
GAUZE SPONGE 2X2 8PLY STRL LF (GAUZE/BANDAGES/DRESSINGS) ×1 IMPLANT
GLOVE BIO SURGEON STRL SZ 6.5 (GLOVE) ×1 IMPLANT
GLOVE BIO SURGEON STRL SZ7 (GLOVE) ×5 IMPLANT
GLOVE BIO SURGEONS STRL SZ 6.5 (GLOVE) ×1
GLOVE BIOGEL PI IND STRL 6.5 (GLOVE) ×2 IMPLANT
GLOVE BIOGEL PI IND STRL 7.0 (GLOVE) IMPLANT
GLOVE BIOGEL PI IND STRL 7.5 (GLOVE) ×1 IMPLANT
GLOVE BIOGEL PI INDICATOR 6.5 (GLOVE) ×4
GLOVE BIOGEL PI INDICATOR 7.0 (GLOVE) ×2
GLOVE BIOGEL PI INDICATOR 7.5 (GLOVE) ×2
GLOVE SURG SS PI 6.5 STRL IVOR (GLOVE) ×2 IMPLANT
GOWN STRL NON-REIN LRG LVL3 (GOWN DISPOSABLE) IMPLANT
GOWN STRL REUS W/ TWL LRG LVL3 (GOWN DISPOSABLE) ×5 IMPLANT
GOWN STRL REUS W/TWL LRG LVL3 (GOWN DISPOSABLE) ×15
KIT BASIN OR (CUSTOM PROCEDURE TRAY) ×3 IMPLANT
KIT ROOM TURNOVER OR (KITS) ×3 IMPLANT
NS IRRIG 1000ML POUR BTL (IV SOLUTION) ×3 IMPLANT
PAD ARMBOARD 7.5X6 YLW CONV (MISCELLANEOUS) ×3 IMPLANT
POUCH SPECIMEN RETRIEVAL 10MM (ENDOMECHANICALS) ×3 IMPLANT
SCISSORS LAP 5X35 DISP (ENDOMECHANICALS) ×3 IMPLANT
SET CHOLANGIOGRAPH 5 50 .035 (SET/KITS/TRAYS/PACK) ×3 IMPLANT
SET IRRIG TUBING LAPAROSCOPIC (IRRIGATION / IRRIGATOR) ×3 IMPLANT
SLEEVE ENDOPATH XCEL 5M (ENDOMECHANICALS) ×3 IMPLANT
SPECIMEN JAR SMALL (MISCELLANEOUS) ×3 IMPLANT
SPONGE GAUZE 2X2 STER 10/PKG (GAUZE/BANDAGES/DRESSINGS) ×2
SUT MNCRL AB 4-0 PS2 18 (SUTURE) ×3 IMPLANT
TOWEL OR 17X24 6PK STRL BLUE (TOWEL DISPOSABLE) ×3 IMPLANT
TOWEL OR 17X26 10 PK STRL BLUE (TOWEL DISPOSABLE) ×3 IMPLANT
TRAY LAPAROSCOPIC (CUSTOM PROCEDURE TRAY) ×3 IMPLANT
TROCAR XCEL BLUNT TIP 100MML (ENDOMECHANICALS) ×3 IMPLANT
TROCAR XCEL NON-BLD 11X100MML (ENDOMECHANICALS) ×3 IMPLANT
TROCAR XCEL NON-BLD 5MMX100MML (ENDOMECHANICALS) ×3 IMPLANT

## 2014-01-16 NOTE — Interval H&P Note (Signed)
History and Physical Interval Note:  01/16/2014 7:36 AM  Claire Blake  has presented today for surgery, with the diagnosis of Chronic calculus cholecystitis  The various methods of treatment have been discussed with the patient and family. After consideration of risks, benefits and other options for treatment, the patient has consented to  Procedure(s): LAPAROSCOPIC CHOLECYSTECTOMY WITH INTRAOPERATIVE CHOLANGIOGRAM (N/A) as a surgical intervention .  The patient's history has been reviewed, patient examined, no change in status, stable for surgery.  I have reviewed the patient's chart and labs.  Questions were answered to the patient's satisfaction.     Claire Blake K.

## 2014-01-16 NOTE — Transfer of Care (Signed)
Immediate Anesthesia Transfer of Care Note  Patient: Claire Blake  Procedure(s) Performed: Procedure(s): LAPAROSCOPIC CHOLECYSTECTOMY WITH INTRAOPERATIVE CHOLANGIOGRAM (N/A)  Patient Location: PACU  Anesthesia Type:General  Level of Consciousness: awake, alert , oriented and patient cooperative  Airway & Oxygen Therapy: Patient Spontanous Breathing and Patient connected to face mask oxygen  Post-op Assessment: Report given to PACU RN, Post -op Vital signs reviewed and stable and Patient moving all extremities  Post vital signs: Reviewed and stable  Complications: No apparent anesthesia complications

## 2014-01-16 NOTE — Consult Note (Signed)
Referring Provider: Dr. Verita Lamb Primary Care Physician:  Cari Caraway, MD Primary Gastroenterologist:  Dr. Teena Irani  Reason for Consultation:  Upper GI bleed  HPI: Claire Blake is a 73 y.o. female who underwent an uncomplicated laparoscopic cholecystectomy today, but while on the operating table, when the oral gastric tube was passed, approximately 200 cc of fresh blood were noted in the stomach. There was no ongoing bleeding or hemodynamic instability. The patient has a several month history of anorexia and takes a daily baby aspirin, but she has no prior history of ulcer disease or GI bleeding. She has not had endoscopic evaluation in the past. She is on a daily 81 mg aspirin without PPI coverage. There has been a decline in her hemoglobin from 12 to 8.5 over the past 2 months, and her BUN when checked on admission was somewhat elevated.   Past Medical History  Diagnosis Date  . Diabetes mellitus without complication   . Hypertension   . Hyperlipidemia   . Thyroid disease   . Obesity   . CKD (chronic kidney disease), stage III   . Glaucoma   . Rheumatic fever     73 yrs old  . Hypothyroidism   . Bronchitis     hx  . Pneumonia     hx  . Anemia     hx    Past Surgical History  Procedure Laterality Date  . Back surgery  2006    ruptured disc  . Abdominal hysterectomy      partial  . Ankle fracture surgery      Prior to Admission medications   Medication Sig Start Date End Date Taking? Authorizing Provider  aspirin 81 MG tablet Take 81 mg by mouth daily.   Yes Historical Provider, MD  Biotin 300 MCG TABS Take 300 mcg by mouth daily.    Yes Historical Provider, MD  Blood Glucose Monitoring Suppl (ONE TOUCH ULTRA SYSTEM KIT) W/DEVICE KIT 1 kit by Does not apply route once.   Yes Historical Provider, MD  Calcium 600-200 MG-UNIT per tablet Take 1 tablet by mouth daily.   Yes Historical Provider, MD  Cholecalciferol (VITAMIN D3) 3000 UNITS TABS Take 3,000 Units by mouth  daily.    Yes Historical Provider, MD  glimepiride (AMARYL) 4 MG tablet Take 4 mg by mouth daily with breakfast.   Yes Historical Provider, MD  hydrochlorothiazide (MICROZIDE) 12.5 MG capsule Take 12.5 mg by mouth daily.   Yes Historical Provider, MD  levothyroxine (SYNTHROID, LEVOTHROID) 88 MCG tablet Take 88 mcg by mouth daily before breakfast.   Yes Historical Provider, MD  lisinopril (PRINIVIL,ZESTRIL) 40 MG tablet Take 40 mg by mouth daily.   Yes Historical Provider, MD  lovastatin (MEVACOR) 40 MG tablet Take 40 mg by mouth at bedtime.   Yes Historical Provider, MD  metFORMIN (GLUCOPHAGE) 1000 MG tablet Take 1,000 mg by mouth 2 (two) times daily with a meal.   Yes Historical Provider, MD  Multiple Vitamin (MULTIVITAMIN WITH MINERALS) TABS tablet Take 1 tablet by mouth daily.   Yes Historical Provider, MD  ondansetron (ZOFRAN ODT) 4 MG disintegrating tablet Take 1 tablet (4 mg total) by mouth every 8 (eight) hours as needed for nausea or vomiting. 01/14/14  Yes Imogene Burn. Tsuei, MD  pioglitazone (ACTOS) 45 MG tablet Take 45 mg by mouth daily.   Yes Historical Provider, MD  cefUROXime (CEFTIN) 500 MG tablet  12/31/13   Historical Provider, MD    Current Facility-Administered Medications  Medication  Dose Route Frequency Provider Last Rate Last Dose  . 0.9 %  sodium chloride infusion   Intravenous Continuous Imogene Burn. Tsuei, MD      . 0.9 %  sodium chloride infusion   Intravenous Continuous Cleotis Nipper, MD      . Doug Sou HOLD] hydrochlorothiazide (MICROZIDE) capsule 12.5 mg  12.5 mg Oral Daily Imogene Burn. Tsuei, MD   12.5 mg at 01/16/14 1330  . [MAR HOLD] insulin aspart (novoLOG) injection 0-15 Units  0-15 Units Subcutaneous 6 times per day Imogene Burn. Tsuei, MD   5 Units at 01/16/14 1328  . [MAR HOLD] levothyroxine (SYNTHROID, LEVOTHROID) tablet 88 mcg  88 mcg Oral QAC breakfast Imogene Burn. Tsuei, MD   88 mcg at 01/16/14 1326  . [MAR HOLD] lisinopril (PRINIVIL,ZESTRIL) tablet 40 mg  40 mg Oral  Daily Imogene Burn. Tsuei, MD   40 mg at 01/16/14 1326  . [facility] HOLD] morphine 2 MG/ML injection 2-4 mg  2-4 mg Intravenous Q2H PRN Imogene Burn. Tsuei, MD   4 mg at 01/16/14 1323  . [MAR HOLD] ondansetron (ZOFRAN) tablet 4 mg  4 mg Oral Q6H PRN Imogene Burn. Tsuei, MD       Or  . Doug Sou HOLD] ondansetron (ZOFRAN) injection 4 mg  4 mg Intravenous Q6H PRN Imogene Burn. Tsuei, MD        Allergies as of 01/10/2014 - Review Complete 01/10/2014  Allergen Reaction Noted  . Zocor [simvastatin]  01/02/2014    Family History  Problem Relation Age of Onset  . COPD Mother   . Cancer Father     throat  . Cancer Maternal Grandmother     bilateral breast    History   Social History  . Marital Status: Divorced    Spouse Name: N/A    Number of Children: N/A  . Years of Education: N/A   Occupational History  . Not on file.   Social History Main Topics  . Smoking status: Never Smoker   . Smokeless tobacco: Never Used  . Alcohol Use: No  . Drug Use: No  . Sexual Activity: Not on file   Other Topics Concern  . Not on file   Social History Narrative  . No narrative on file    Review of Systems: See history of present illness  Physical Exam: Vital signs in last 24 hours: Temp:  [97.2 F (36.2 C)-98.3 F (36.8 C)] 98.3 F (36.8 C) (02/12 1427) Pulse Rate:  [81-110] 81 (02/12 1427) Resp:  [17-25] 19 (02/12 1427) BP: (113-176)/(42-61) 113/42 mmHg (02/12 1427) SpO2:  [93 %-100 %] 100 % (02/12 1427) Weight:  [115.3 kg (254 lb 3.1 oz)] 115.3 kg (254 lb 3.1 oz) (02/12 1141)   This is a pleasant, articulate, but quite anxious and tearful overweight Caucasian female in no distress. Vital signs are satisfactory, mild tachycardia, no hypotension. She is awake and alert. Chest clear, heart without murmur or arrhythmia. Abdomen has some right-sided tenderness consistent with incisional soreness from her laparoscopic surgery earlier today, but the rest of the abdomen is soft and nontender  Intake/Output  from previous day:   Intake/Output this shift: Total I/O In: 1175 [I.V.:925; IV Piggyback:250] Out: -   Lab Results:  Recent Labs  01/14/14 1531  WBC 11.2*  HGB 8.5*  HCT 26.2*  PLT 540*   BMET  Recent Labs  01/14/14 1531  NA 133*  K 4.7  CL 92*  CO2 25  GLUCOSE 175*  BUN 41*  CREATININE 1.21*  CALCIUM 9.6   LFT No results found for this basename: PROT, ALBUMIN, AST, ALT, ALKPHOS, BILITOT, BILIDIR, IBILI,  in the last 72 hours PT/INR No results found for this basename: LABPROT, INR,  in the last 72 hours   Studies/Results: Dg Chest 2 View  01/14/2014   CLINICAL DATA:  Hypertension.  EXAM: CHEST  2 VIEW  COMPARISON:  July 01, 2010.  FINDINGS: Stable cardiomediastinal silhouette. No acute pulmonary disease is noted. Stable small focus of scarring is seen laterally in left lower lobe. No pleural effusion or pneumothorax is noted. Degenerative changes of mid thoracic spine are noted.  IMPRESSION: No active cardiopulmonary disease.   Electronically Signed   By: Sabino Dick M.D.   On: 01/14/2014 15:40   Dg Cholangiogram Operative  01/16/2014   CLINICAL DATA:  Cholecystitis  EXAM: INTRAOPERATIVE CHOLANGIOGRAM  TECHNIQUE: Cholangiographic images from the C-arm fluoroscopic device were submitted for interpretation post-operatively. Please see the procedural report for the amount of contrast and the fluoroscopy time utilized.  COMPARISON:  None.  FINDINGS: No persistent filling defects in the common duct. Intrahepatic ducts are incompletely visualized, appearing decompressed centrally. Contrast passes into the duodenum.  : Negative for retained common duct stone.   Electronically Signed   By: Arne Cleveland M.D.   On: 01/16/2014 10:11    Impression: Upper GI bleed of unclear cause, possibly aspirin induced gastropathy  Plan: Endoscopic evaluation this afternoon with further management to deep depend on those findings. She will possibly need a PPI and serial monitoring of  hemoglobin levels. I have discussed the nature, purpose and risks of the procedure with the patient and her daughter and they are agreeable to proceed.   LOS: 0 days   Somara Frymire V  01/16/2014, 3:17 PM

## 2014-01-16 NOTE — Anesthesia Preprocedure Evaluation (Addendum)
Anesthesia Evaluation  Patient identified by MRN, date of birth, ID band Patient awake    Reviewed: Allergy & Precautions, H&P , NPO status , Patient's Chart, lab work & pertinent test results  Airway Mallampati: II  Neck ROM: full    Dental   Pulmonary pneumonia -,  Recent  history of bronchitis. CE breath sounds clear to auscultation        Cardiovascular hypertension, Rhythm:Regular Rate:Normal     Neuro/Psych    GI/Hepatic negative GI ROS, Neg liver ROS, History of gallstones.   Endo/Other  diabetesHypothyroidism obese  Renal/GU Renal InsufficiencyRenal disease     Musculoskeletal   Abdominal   Peds  Hematology  (+) anemia ,   Anesthesia Other Findings   Reproductive/Obstetrics                          Anesthesia Physical Anesthesia Plan  ASA: III  Anesthesia Plan: General   Post-op Pain Management:    Induction: Intravenous  Airway Management Planned: Oral ETT  Additional Equipment:   Intra-op Plan:   Post-operative Plan: Extubation in OR  Informed Consent: I have reviewed the patients History and Physical, chart, labs and discussed the procedure including the risks, benefits and alternatives for the proposed anesthesia with the patient or authorized representative who has indicated his/her understanding and acceptance.   Dental advisory given  Plan Discussed with: CRNA, Anesthesiologist and Surgeon  Anesthesia Plan Comments:        Anesthesia Quick Evaluation

## 2014-01-16 NOTE — H&P (View-Only) (Signed)
Patient ID: Claire Blake, female   DOB: [birth year], 73 y.o.   MRN: [id number]  Chief Complaint  Patient presents with  . New Evaluation    eval Gallstones    HPI Claire Blake is a 73 y.o. female.  Referred by Dr. Cari Caraway  HPI This is a 73 year old female with hypertension type 2 diabetes hyperlipidemia hypothyroidism obesity and stage III chronic kidney disease that presents with recent development of decreased appetite.  The patient states that, ever since Christmas, she has not had much appetite. When she tries to eat, she gets some mild nausea.  She also reports significant abdominal bloating associated with belching and flatulence. She denies any change in her bowel habits. She does not really have any significant abdominal pain. She did have one episode of generalized abdominal pain after eating.  She presented to Dr. Addison Lank with the symptoms. Her workup included an ultrasound as well as liver function tests and lipase. Her blood work was normal but her ultrasound showed multiple layering gallstones. She is now referred for surgical evaluation for cholecystectomy. Past Medical History  Diagnosis Date  . Diabetes mellitus without complication   . Hypertension   . Hyperlipidemia   . Thyroid disease   . Obesity   . CKD (chronic kidney disease), stage III   . Glaucoma     Past Surgical History  Procedure Laterality Date  . Back surgery  2006    ruptured disc  . Abdominal hysterectomy      partial  . Ankle fracture surgery      Family History  Problem Relation Age of Onset  . COPD Mother   . Cancer Father     throat  . Cancer Maternal Grandmother     bilateral breast    Social History History  Substance Use Topics  . Smoking status: Never Smoker   . Smokeless tobacco: Never Used  . Alcohol Use: No    Allergies  Allergen Reactions  . Zocor [Simvastatin]     Leg cramps    Current Outpatient Prescriptions  Medication Sig Dispense Refill  . aspirin 81  MG tablet Take 81 mg by mouth daily.      . Biotin 300 MCG TABS Take 400 mcg by mouth.      . Blood Glucose Monitoring Suppl (ONE TOUCH ULTRA SYSTEM KIT) W/DEVICE KIT 1 kit by Does not apply route once.      . Calcium 600-200 MG-UNIT per tablet Take 1 tablet by mouth daily.      . cefUROXime (CEFTIN) 500 MG tablet       . Cholecalciferol (VITAMIN D3) 3000 UNITS TABS Take by mouth.      Marland Kitchen glimepiride (AMARYL) 4 MG tablet Take 4 mg by mouth daily with breakfast.      . hydrochlorothiazide (MICROZIDE) 12.5 MG capsule Take 12.5 mg by mouth daily.      Marland Kitchen levothyroxine (SYNTHROID, LEVOTHROID) 88 MCG tablet Take 88 mcg by mouth daily before breakfast.      . lisinopril (PRINIVIL,ZESTRIL) 40 MG tablet Take 40 mg by mouth daily.      Marland Kitchen lovastatin (MEVACOR) 40 MG tablet Take 40 mg by mouth at bedtime.      . metFORMIN (GLUCOPHAGE) 1000 MG tablet Take 1,000 mg by mouth 2 (two) times daily with a meal.      . Multiple Vitamin (MULTIVITAMIN WITH MINERALS) TABS tablet Take 1 tablet by mouth daily.      . pioglitazone (ACTOS) 45 MG tablet  Take 45 mg by mouth daily.       No current facility-administered medications for this visit.    Review of Systems Review of Systems  Constitutional: Negative for fever, chills and unexpected weight change.  HENT: Negative for congestion, hearing loss, sore throat, trouble swallowing and voice change.   Eyes: Negative for visual disturbance.  Respiratory: Negative for cough and wheezing.   Cardiovascular: Negative for chest pain, palpitations and leg swelling.  Gastrointestinal: Positive for nausea and abdominal distention. Negative for vomiting, abdominal pain, diarrhea, constipation, blood in stool and anal bleeding.  Genitourinary: Negative for hematuria, vaginal bleeding and difficulty urinating.  Musculoskeletal: Negative for arthralgias.  Skin: Negative for rash and wound.  Neurological: Negative for seizures, syncope and headaches.  Hematological: Negative for  adenopathy. Does not bruise/bleed easily.  Psychiatric/Behavioral: Negative for confusion.    Blood pressure 138/68, pulse 84, temperature 97.3 F (36.3 C), temperature source Oral, resp. rate 15, height '5\' 8"'  (1.727 m), weight 245 lb 9.6 oz (111.403 kg).  Physical Exam Physical Exam WDWN in NAD HEENT:  EOMI, sclera anicteric Neck:  No masses, no thyromegaly Lungs:  CTA bilaterally; normal respiratory effort CV:  Regular rate and rhythm; no murmurs Abd:  +bowel sounds, soft, non-tender, no masses Ext:  Well-perfused; no edema Skin:  Warm, dry; no sign of jaundice  Data Reviewed US Abdomen Complete  12/25/2013   CLINICAL DATA:  Early satiety.  Diabetes  EXAM: ULTRASOUND ABDOMEN COMPLETE  COMPARISON:  None.  FINDINGS: Gallbladder:  Multiple small layering gallstones without gallbladder wall thickening. Negative sonographic Murphy sign.  Common bile duct:  Diameter: 3.0 mm  Liver:  Echogenic liver without focal lesion.  IVC:  Limited  Pancreas:  Limited  Spleen:  Size and appearance within normal limits.  Right Kidney:  Length: 10.0 cm in length. Echogenicity within normal limits. No mass or hydronephrosis visualized.  Left Kidney:  Length: 12.3 cm in length. Echogenicity within normal limits. No mass or hydronephrosis visualized.  Abdominal aorta:  No aneurysm visualized.  Other findings:  Limited by bowel gas  IMPRESSION: Small layering gallstones without biliary obstruction.  Fatty infiltration of the liver.   Electronically Signed   By: Franchot Gallo M.D.   On: 12/25/2013 11:30    12/17/13 Tbili 0.4 Alk Phos 54 AST 13 ALT 11 Lipase 31   Assessment    Chronic calculus cholecystitis which is likely causing her symptoms.       Plan    Laparoscopic cholecystectomy with intraoperative cholangiogram. The surgical procedure has been discussed with the patient.  Potential risks, benefits, alternative treatments, and expected outcomes have been explained.  All of the patient's  questions at this time have been answered.  The likelihood of reaching the patient's treatment goal is good.  The patient understand the proposed surgical procedure and wishes to proceed.         Chelsei Mcchesney K. 01/10/2014, 11:30 AM

## 2014-01-16 NOTE — Anesthesia Postprocedure Evaluation (Signed)
Anesthesia Post Note  Patient: Claire Blake  Procedure(s) Performed: Procedure(s) (LRB): LAPAROSCOPIC CHOLECYSTECTOMY WITH INTRAOPERATIVE CHOLANGIOGRAM (N/A)  Anesthesia type: General  Patient location: PACU  Post pain: Pain level controlled and Adequate analgesia  Post assessment: Post-op Vital signs reviewed, Patient's Cardiovascular Status Stable, Respiratory Function Stable, Patent Airway and Pain level controlled  Last Vitals:  Filed Vitals:   01/16/14 1015  BP:   Pulse: 95  Temp:   Resp: 23    Post vital signs: Reviewed and stable  Level of consciousness: awake, alert  and oriented  Complications: No apparent anesthesia complications

## 2014-01-16 NOTE — OR Nursing (Signed)
Upon arrival to the operating room for a laparoscopic cholecystectomy and following intubation by anesthesia, a nasogastric tube was placed by Einar CrowValerie Mumm, CRNA. Approximately 150mL of blood was suctioned for this. Manus RuddMatthew Tsuei, MD was notified and endoscopy was notified. An endoscopy was planned for later that day due to "unavailable traveling equipment."  Oralia ManisAnna Darly Fails, RN

## 2014-01-16 NOTE — Anesthesia Procedure Notes (Signed)
Procedure Name: Intubation Date/Time: 01/16/2014 8:47 AM Performed by: Jerilee HohMUMM, Gavyn Ybarra N Pre-anesthesia Checklist: Patient identified, Emergency Drugs available, Suction available and Patient being monitored Patient Re-evaluated:Patient Re-evaluated prior to inductionOxygen Delivery Method: Circle system utilized Preoxygenation: Pre-oxygenation with 100% oxygen Intubation Type: IV induction, Rapid sequence and Cricoid Pressure applied Laryngoscope Size: Mac and 3 Grade View: Grade I Tube type: Oral Tube size: 7.0 mm Number of attempts: 1 Airway Equipment and Method: Stylet Placement Confirmation: ETT inserted through vocal cords under direct vision,  positive ETCO2 and breath sounds checked- equal and bilateral Secured at: 22 cm Tube secured with: Tape Dental Injury: Teeth and Oropharynx as per pre-operative assessment

## 2014-01-16 NOTE — Op Note (Signed)
[facility] [address] [place], [id number]   ENDOSCOPY PROCEDURE REPORT  PATIENT: Claire Blake, Claire M.  MR#: [id number] BIRTHDATE: 10-Nov-1941 , 73  yrs. old GENDER: Female ENDOSCOPIST:Tacora Athanas, MD REFERRED BY:  Dr. Marcille BlancoMatt Tsuei, Dr. Gweneth DimitriWendy McNeill PROCEDURE DATE:  01/16/2014 PROCEDURE:      upper endoscopy ASA CLASS: INDICATIONS:   upper GI bleeding. This 73 year old female underwent a laparoscopic cholecystectomy today, and when the orogastric tube was passed and her stomach under anesthesia, about 200 ML's of fresh blood were recovered. The bleeding stopped on its own. The patient has been on a daily baby aspirin prior to admission and is not on PPI therapy. She does have a history of anorexia recently. MEDICATION:    fentanyl 25 mcg, Versed 6 mg IV TOPICAL ANESTHETIC:    none  DESCRIPTION OF PROCEDURE:   I discussed the nature, purpose, and risks of the procedure with the patient and her daughter, the latter of whom provided written consent on behalf of the patient who had had general anesthesia earlier today. She had been brought from her hospital room. Time out was performed and she received the above sedation without clinical instability. I did keep sedation in the light side in view of her body habitus and concerns about airway protection.  The Pentax adult video endoscope was passed under direct vision. The vocal cords were not well seen but the esophagus was readily entered.  At the GE junction, there was a linear tear with overlying adherent clot, consistent with a Mallory-Weiss tear. It was not bleeding.  The remainder of the esophagus was normal. There were no varices, there was no evidence of reflux esophagitis, infection, neoplasia, ring, stricture, or hiatal hernia.  The stomach was entered. It contained a moderate amount of dark, clotted "old motor oil" old blood. There was no murmur and blood, no fresh red blood. All  visualized portions of the stomach looked normal, specifically without evidence of masses, ulceration, gastritis, erosions, or vascular ectasia. A retroflex view of the proximal stomach did not disclose any pathology but a moderate amount of proximal greater curve wasn't secured by the above-mentioned dark clotted blood.  The pylorus, duodenal bulb, and second duodenum were well examined and appeared normal.  I elected not to attempt intervention in the Mallory-Weiss tear, so the scope was removed from the patient who tolerated the procedure quite well.     COMPLICATIONS: None  ENDOSCOPIC IMPRESSION:  1. No active bleeding at the time this exam 2. Old blood present in the stomach, consistent with recent bleeding 3. Mallory-Weiss tear at the GE junction with stigma of recent hemorrhage 4. No evidence of aspirin gastropathy or other pathology in the body, antrum, pylorus, duodenal bulb, or second duodenum. Proximal stomach partially and secured by residual blood as described above  RECOMMENDATIONS:  1. Okay for liquid diet 2. high-dose PPI therapy for the next 2 weeks 3. I would consider repeat endoscopy if the patient does not have resolution of her symptoms of anorexia now that her gallbladder has been removed, and/or if she remains Hemoccult positive as an outpatient.    _______________________________ Rosalie DoctoreSignedBernette Redbird:  Rudie Rikard, MD 01/16/2014 3:56 PM    PATIENT NAME:  Claire Blake, Claire M. MR#: [id number]

## 2014-01-16 NOTE — Progress Notes (Signed)
endosco endoscopy shows a Mallory-Weiss tear without active, but with stigmata of hemorrhage and old blood in the stomach.  Please see dictated procedure report for more detailed findings and recommendations.  One thing I did not mention in the procedure report is that, if possible, I would try to hold this patient's aspirin for another 3 days, until the Mallory-Weiss tear has had a chance to heal more completely.  Florencia Reasonsobert V. Brannen Koppen, M.D. [phone]

## 2014-01-16 NOTE — Op Note (Signed)
Laparoscopic Cholecystectomy with IOC Procedure Note  Indications: This patient presents with symptomatic gallbladder disease and will undergo laparoscopic cholecystectomy.  Pre-operative Diagnosis: Calculus of gallbladder with other cholecystitis, without mention of obstruction  Post-operative Diagnosis: Same    Gross blood in stomach  Surgeon: Koran Seabrook K.   Assistants: Dr. Chevis PrettyPaul Toth  Anesthesia: General endotracheal anesthesia  ASA Class: 1  Procedure Details  The patient was seen again in the Holding Room. The risks, benefits, complications, treatment options, and expected outcomes were discussed with the patient. The possibilities of reaction to medication, pulmonary aspiration, perforation of viscus, bleeding, recurrent infection, finding a normal gallbladder, the need for additional procedures, failure to diagnose a condition, the possible need to convert to an open procedure, and creating a complication requiring transfusion or operation were discussed with the patient. The likelihood of improving the patient's symptoms with return to their baseline status is good.  The patient and/or family concurred with the proposed plan, giving informed consent. The site of surgery properly noted. The patient was taken to Operating Room, identified as Claire Blake and the procedure verified as Laparoscopic Cholecystectomy with Intraoperative Cholangiogram. A Time Out was held and the above information confirmed.  Prior to the induction of general anesthesia, antibiotic prophylaxis was administered. General endotracheal anesthesia was then administered and tolerated well. After the induction, the abdomen was prepped with Chloraprep and draped in the sterile fashion. The patient was positioned in the supine position.  At this point, gross blood was noted in her orogastric tube.  There was a total of about 200 ml of blood.  We called GI to see if they were available to perform an intraoperative  endoscopy, but there is no available equipment.  We will admit to the hospital post-op and they will perform EGD later.  Local anesthetic agent was injected into the skin near the umbilicus and an incision made. We dissected down to the abdominal fascia with blunt dissection.  The fascia was incised vertically and we entered the peritoneal cavity bluntly.  A pursestring suture of 0-Vicryl was placed around the fascial opening.  The Hasson cannula was inserted and secured with the stay suture.  Pneumoperitoneum was then created with CO2 and tolerated well without any adverse changes in the patient's vital signs. An 11-mm port was placed in the subxiphoid position.  Two 5-mm ports were placed in the right upper quadrant. All skin incisions were infiltrated with a local anesthetic agent before making the incision and placing the trocars.   We positioned the patient in reverse Trendelenburg, tilted slightly to the patient's left.  The gallbladder was identified, the fundus grasped and retracted cephalad. There were no significant adhesions to the gallbladder.  The infundibulum was grasped and retracted laterally, exposing the peritoneum overlying the triangle of Calot. This was then divided and exposed in a blunt fashion. A critical view of the cystic duct and cystic artery was obtained.  The cystic duct was clearly identified and bluntly dissected circumferentially. The cystic duct was ligated with a clip distally.   An incision was made in the cystic duct and the [facility] introduced. The catheter was secured using a clip. A cholangiogram was then obtained which showed good visualization of the distal and proximal biliary tree with no sign of filling defects or obstruction.  Contrast flowed easily into the duodenum. The catheter was then removed.   The cystic duct was then ligated with clips and divided. The cystic artery was identified, dissected free, ligated  with clips and divided as well.    The gallbladder was dissected from the liver bed in retrograde fashion with the electrocautery. The gallbladder was removed and placed in an Endocatch sac. The liver bed was irrigated and inspected. Hemostasis was achieved with the electrocautery. Copious irrigation was utilized and was repeatedly aspirated until clear.  The gallbladder and Endocatch sac were then removed through the umbilical port site.  The pursestring suture was used to close the umbilical fascia.    We again inspected the right upper quadrant for hemostasis.  Pneumoperitoneum was released as we removed the trocars.  4-0 Monocryl was used to close the skin.   Benzoin, steri-strips, and clean dressings were applied. The patient was then extubated and brought to the recovery room in stable condition. Instrument, sponge, and needle counts were correct at closure and at the conclusion of the case.   Findings: Cholecystitis with Cholelithiasis  Estimated Blood Loss: 200 mL from the stomach via OG Tube         Drains: none         Specimens: Gallbladder           Complications: None; patient tolerated the procedure well.         Disposition: Will admit to the hospital for GI to evaluate and perform EGD         Condition: stable  Wilmon Arms. Corliss Skains, MD, [facility]  General/ Trauma Surgery  01/16/2014 9:48 AM

## 2014-01-17 ENCOUNTER — Encounter (HOSPITAL_COMMUNITY): Payer: Self-pay | Admitting: Gastroenterology

## 2014-01-17 LAB — COMPREHENSIVE METABOLIC PANEL
ALT: 36 U/L — ABNORMAL HIGH (ref 0–35)
AST: 77 U/L — AB (ref 0–37)
Albumin: 2.5 g/dL — ABNORMAL LOW (ref 3.5–5.2)
Alkaline Phosphatase: 71 U/L (ref 39–117)
BILIRUBIN TOTAL: 0.3 mg/dL (ref 0.3–1.2)
BUN: 43 mg/dL — ABNORMAL HIGH (ref 6–23)
CALCIUM: 9 mg/dL (ref 8.4–10.5)
CO2: 27 mEq/L (ref 19–32)
CREATININE: 1.46 mg/dL — AB (ref 0.50–1.10)
Chloride: 96 mEq/L (ref 96–112)
GFR calc Af Amer: 40 mL/min — ABNORMAL LOW (ref >90)
GFR calc non Af Amer: 35 mL/min — ABNORMAL LOW (ref >90)
Glucose, Bld: 172 mg/dL — ABNORMAL HIGH (ref 70–99)
Potassium: 4.8 mEq/L (ref 3.7–5.3)
Sodium: 134 mEq/L — ABNORMAL LOW (ref 137–147)
TOTAL PROTEIN: 6.5 g/dL (ref 6.0–8.3)

## 2014-01-17 LAB — CBC
HCT: 25.6 % — ABNORMAL LOW (ref 36.0–46.0)
HEMATOCRIT: 19.4 % — AB (ref 36.0–46.0)
HEMOGLOBIN: 6.1 g/dL — AB (ref 12.0–15.0)
Hemoglobin: 8.3 g/dL — ABNORMAL LOW (ref 12.0–15.0)
MCH: 27 pg (ref 26.0–34.0)
MCH: 27.7 pg (ref 26.0–34.0)
MCHC: 31.4 g/dL (ref 30.0–36.0)
MCHC: 32.4 g/dL (ref 30.0–36.0)
MCV: 85.8 fL (ref 78.0–100.0)
MCV: 85.3 fL (ref 78.0–100.0)
PLATELETS: 382 10*3/uL (ref 150–400)
PLATELETS: 414 10*3/uL — AB (ref 150–400)
RBC: 2.26 MIL/uL — AB (ref 3.87–5.11)
RBC: 3 MIL/uL — ABNORMAL LOW (ref 3.87–5.11)
RDW: 14.9 % (ref 11.5–15.5)
RDW: 15.3 % (ref 11.5–15.5)
WBC: 7.9 10*3/uL (ref 4.0–10.5)
WBC: 8.6 10*3/uL (ref 4.0–10.5)

## 2014-01-17 LAB — BASIC METABOLIC PANEL
BUN: 42 mg/dL — AB (ref 6–23)
CO2: 26 mEq/L (ref 19–32)
Calcium: 8.9 mg/dL (ref 8.4–10.5)
Chloride: 97 mEq/L (ref 96–112)
Creatinine, Ser: 1.4 mg/dL — ABNORMAL HIGH (ref 0.50–1.10)
GFR, EST AFRICAN AMERICAN: 42 mL/min — AB (ref >90)
GFR, EST NON AFRICAN AMERICAN: 37 mL/min — AB (ref >90)
Glucose, Bld: 183 mg/dL — ABNORMAL HIGH (ref 70–99)
POTASSIUM: 4.5 meq/L (ref 3.7–5.3)
Sodium: 136 mEq/L — ABNORMAL LOW (ref 137–147)

## 2014-01-17 LAB — GLUCOSE, CAPILLARY
GLUCOSE-CAPILLARY: 145 mg/dL — AB (ref 70–99)
Glucose-Capillary: 151 mg/dL — ABNORMAL HIGH (ref 70–99)
Glucose-Capillary: 161 mg/dL — ABNORMAL HIGH (ref 70–99)
Glucose-Capillary: 133 mg/dL — ABNORMAL HIGH (ref 70–99)

## 2014-01-17 LAB — PREPARE RBC (CROSSMATCH)

## 2014-01-17 LAB — ABO/RH: ABO/RH(D): A POS

## 2014-01-17 NOTE — Progress Notes (Signed)
1 Day Post-Op  Subjective: Feels well.  No vomiting.  Objective: Vital signs in last 24 hours: Temp:  [97.2 F (36.2 C)-99 F (37.2 C)] 99 F (37.2 C) (02/13 0600) Pulse Rate:  [80-115] 93 (02/13 0600) Resp:  [16-29] 16 (02/13 0600) BP: (100-176)/(23-80) 114/47 mmHg (02/13 0600) SpO2:  [92 %-100 %] 92 % (02/13 0600) Weight:  [254 lb 3.1 oz (115.3 kg)] 254 lb 3.1 oz (115.3 kg) (02/12 1141)    Intake/Output from previous day: 02/12 0701 - 02/13 0700 In: 2973 [P.O.:450; I.V.:2273; IV Piggyback:250] Out: -  Intake/Output this shift:    Incision/Wound:soft non tender.  Sites clean  Lab Results:   Recent Labs  01/14/14 1531 01/17/14 0635  WBC 11.2* 7.9  HGB 8.5* 6.1*  HCT 26.2* 19.4*  PLT 540* 382   BMET  Recent Labs  01/14/14 1531 01/17/14 0635  NA 133* 134*  K 4.7 4.8  CL 92* 96  CO2 25 27  GLUCOSE 175* 172*  BUN 41* 43*  CREATININE 1.21* 1.46*  CALCIUM 9.6 9.0   PT/INR No results found for this basename: LABPROT, INR,  in the last 72 hours ABG No results found for this basename: PHART, PCO2, PO2, HCO3,  in the last 72 hours  Studies/Results: Dg Cholangiogram Operative  01/16/2014   CLINICAL DATA:  Cholecystitis  EXAM: INTRAOPERATIVE CHOLANGIOGRAM  TECHNIQUE: Cholangiographic images from the C-arm fluoroscopic device were submitted for interpretation post-operatively. Please see the procedural report for the amount of contrast and the fluoroscopy time utilized.  COMPARISON:  None.  FINDINGS: No persistent filling defects in the common duct. Intrahepatic ducts are incompletely visualized, appearing decompressed centrally. Contrast passes into the duodenum.  : Negative for retained common duct stone.   Electronically Signed   By: Oley Balmaniel  Hassell M.D.   On: 01/16/2014 10:11    Anti-infectives: Anti-infectives   Start     Dose/Rate Route Frequency Ordered Stop   01/16/14 0600  ceFAZolin (ANCEF) IVPB 2 g/50 mL premix     2 g 100 mL/hr over 30 Minutes  Intravenous On call to O.R. 01/15/14 1412 01/16/14 0851      Assessment/Plan: s/p Procedure(s): ESOPHAGOGASTRODUODENOSCOPY (EGD) (N/A) Patient Active Problem List   Diagnosis Date Noted  . Chronic cholecystitis 01/16/2014  . Chronic cholecystitis with calculus 01/10/2014  Mallory Weiss tear Anemia GI bleed  Transfuse 2 U PRBC Recheck in am   LOS: 1 day    Zamaria Brazzle A. 01/17/2014

## 2014-01-17 NOTE — Progress Notes (Signed)
Hemoglobin has dropped from her pre-admission, pre-bleed level of 8.5 to a current level of 6.1, without clinical evidence of further bleeding since I did her endoscopy yesterday. Specifically, no hematemesis her bowel movements, no hypotension or orthostatic symptoms. Feels well. No dizziness when she gets up.  Her BUN remains elevated, but stable compared to pre-admission value.  Exam: The patient looks slightly pale, but is sitting up in a bedside chair in absolutely no distress. Skin is warm, radial pulse is full, blood pressure and heart rate normal.  Impression: it appears that the drop in hemoglobin is a result of the recognized bleed yesterday, associated with a Mallory-Weiss tear that apparently occurred in association with passage of her oral gastric tube. It does not appear that this patient is having further, or ongoing bleeding.  Plan:  1. Agree with plan for transfusion. I would defer to the surgeon whether to transfuse one unit, as currently planned, or 2 units to definitely bring her up above 7.  2. I will recheck labs later today, and be on standby to re-endoscope the patient in the unlikely event that there is failure of her hemoglobin to rise, or other evidence of ongoing bleeding (doubt). Accordingly, I will keep the patient on a clear liquid diet for the time being.  3. Consider observing the patient one more night in the hospital to allow sufficient time to verify that her hemoglobin is holding stable posttransfusion. We will plan to recheck labs and round again on the patient tomorrow morning, assuming she does not go home this evening.  4. Please call us if you would like to discuss her case in further detail, or if we can be of further assistance in her care in the meantime.  Florencia Reasonsobert V. Michi Herrmann, M.D. [phone]

## 2014-01-17 NOTE — Progress Notes (Signed)
Critical lab result of  hgb 6.1, reported to incoming RN.

## 2014-01-17 NOTE — Progress Notes (Signed)
H/H reported to Dr. Janee Mornhompson, orders received.

## 2014-01-17 NOTE — Progress Notes (Signed)
UR completed. Patient changed to inpatient- requiring PRBC post-op

## 2014-01-18 DIAGNOSIS — K922 Gastrointestinal hemorrhage, unspecified: Secondary | ICD-10-CM

## 2014-01-18 DIAGNOSIS — D649 Anemia, unspecified: Secondary | ICD-10-CM

## 2014-01-18 LAB — COMPREHENSIVE METABOLIC PANEL
ALT: 33 U/L (ref 0–35)
AST: 50 U/L — ABNORMAL HIGH (ref 0–37)
Albumin: 2.4 g/dL — ABNORMAL LOW (ref 3.5–5.2)
Alkaline Phosphatase: 88 U/L (ref 39–117)
BUN: 38 mg/dL — ABNORMAL HIGH (ref 6–23)
CO2: 26 mEq/L (ref 19–32)
Calcium: 8.8 mg/dL (ref 8.4–10.5)
Chloride: 99 mEq/L (ref 96–112)
Creatinine, Ser: 1.31 mg/dL — ABNORMAL HIGH (ref 0.50–1.10)
GFR calc Af Amer: 46 mL/min — ABNORMAL LOW (ref >90)
GFR calc non Af Amer: 40 mL/min — ABNORMAL LOW (ref >90)
Glucose, Bld: 135 mg/dL — ABNORMAL HIGH (ref 70–99)
Potassium: 4.6 mEq/L (ref 3.7–5.3)
Sodium: 137 mEq/L (ref 137–147)
Total Bilirubin: 0.6 mg/dL (ref 0.3–1.2)
Total Protein: 6.1 g/dL (ref 6.0–8.3)

## 2014-01-18 LAB — CBC
HCT: 22.8 % — ABNORMAL LOW (ref 36.0–46.0)
HCT: 23.3 % — ABNORMAL LOW (ref 36.0–46.0)
Hemoglobin: 7.4 g/dL — ABNORMAL LOW (ref 12.0–15.0)
Hemoglobin: 7.8 g/dL — ABNORMAL LOW (ref 12.0–15.0)
MCH: 27.6 pg (ref 26.0–34.0)
MCH: 28.6 pg (ref 26.0–34.0)
MCHC: 32.5 g/dL (ref 30.0–36.0)
MCHC: 33.5 g/dL (ref 30.0–36.0)
MCV: 85.1 fL (ref 78.0–100.0)
MCV: 85.3 fL (ref 78.0–100.0)
PLATELETS: 377 10*3/uL (ref 150–400)
Platelets: 335 10*3/uL (ref 150–400)
RBC: 2.68 MIL/uL — ABNORMAL LOW (ref 3.87–5.11)
RBC: 2.73 MIL/uL — AB (ref 3.87–5.11)
RDW: 15.5 % (ref 11.5–15.5)
RDW: 15.9 % — ABNORMAL HIGH (ref 11.5–15.5)
WBC: 7.8 10*3/uL (ref 4.0–10.5)
WBC: 7.7 10*3/uL (ref 4.0–10.5)

## 2014-01-18 LAB — TYPE AND SCREEN
ABO/RH(D): A POS
Antibody Screen: NEGATIVE
Unit division: 0
Unit division: 0

## 2014-01-18 LAB — GLUCOSE, CAPILLARY
GLUCOSE-CAPILLARY: 154 mg/dL — AB (ref 70–99)
Glucose-Capillary: 163 mg/dL — ABNORMAL HIGH (ref 70–99)

## 2014-01-18 MED ORDER — SODIUM CHLORIDE 0.9 % IJ SOLN: 3.0000 mL | INTRAMUSCULAR | Status: DC | PRN

## 2014-01-18 MED ORDER — OXYCODONE-ACETAMINOPHEN 5-325 MG PO TABS: 1.0000 | ORAL_TABLET | ORAL | Status: DC | PRN

## 2014-01-18 MED ORDER — ESOMEPRAZOLE MAGNESIUM 40 MG PO CPDR: 40.0000 mg | DELAYED_RELEASE_CAPSULE | Freq: Two times a day (BID) | ORAL | Status: DC

## 2014-01-18 MED ORDER — FERROUS SULFATE 325 (65 FE) MG PO TABS: 325.0000 mg | ORAL_TABLET | Freq: Two times a day (BID) | ORAL | Status: DC

## 2014-01-18 MED ORDER — POLYETHYLENE GLYCOL 3350 17 G PO PACK
17.0000 g | PACK | Freq: Two times a day (BID) | ORAL | Status: DC
  Administered 2014-01-18: 17 g via ORAL
  Filled 2014-01-18: qty 1

## 2014-01-18 MED ORDER — SODIUM CHLORIDE 0.9 % IJ SOLN: 3.0000 mL | Freq: Two times a day (BID) | INTRAMUSCULAR | Status: DC

## 2014-01-18 NOTE — Discharge Summary (Signed)
Physician Discharge Summary  ED RAYSON HBZ:[id number] DOB: 09-02-1941 DOA: 01/16/2014  PCP: Cari Caraway, MD  Consultation: GI-Dr. Cristina Gong   Admit date: 01/16/2014 Discharge date: 01/18/2014  Recommendations for Outpatient Follow-up:   Follow-up Information   Follow up with Maia Petties., MD. Schedule an appointment as soon as possible for a visit in 2 weeks. (post op check)    Specialty:  General Surgery   Contact information:   [place] [address] [phone]       Follow up with [facility], MD In 2 weeks. (follow up anemia)    Specialty:  Family Medicine   Contact information:   [place] [address] [phone]      Discharge Diagnoses:  1. Laparoscopic cholecystectomy 2. GI bleed 3. Anemia 4. Mallory-weiss tear   Surgical Procedure: S/p EGD 2/12--no active bleeding, mallory-weiss tear at GE junction       Laparoscopic cholecystectomy Dr. Tsuei---01/16/14   Discharge Condition: stable Disposition: home  Diet recommendation: regular  Filed Weights   01/16/14 1141  Weight: 115.3 kg (254 lb 3.1 oz)    Filed Vitals:   01/18/14 1320  BP: 136/53  Pulse: 93  Temp: 98 F (36.7 C)  Resp: 16    Hospital Course:  Cheynne Virden presented to [facility] for an elective cholecystectomy with Dr. Georgette Dover on 01/16/14.  She was found to have a GI bleed.  Dr, Buccini was consulted and the patient underwent a EGD the same day which revealed no active bleeding, old blood in the stomach, mallory weiss tear at the GE junction.  Her hemoglobin dropped to 6.1 the following day.  She was transfused 2 units of PRBC, the following day the hemoglobin was stable at 7.8.  She was tolerating a diet, passing flatus and voiding.  She was therefore felt stable for discharge.  She was advised to hold off on resuming her aspirin and is to follow up with PCP to manage the anemia.  Should she have further problems she will need outpatient GI  referral.  We discussed warning signs of bleeding that warrant immediate attention. She was started on PPI x2 weeks as recommended by GI.  I also started her on iron supplement.  Her initial hgb was 8.5 and she has chronic anemia.  This will need to be worked up on outpatient basis.   Discharge Instructions   Future Appointments Provider Department Dept Phone   02/03/2014 9:40 AM Imogene Burn. Dana, [facility], [place] [phone]       Medication List    STOP taking these medications       aspirin 81 MG tablet      TAKE these medications       Biotin 300 MCG Tabs  Take 300 mcg by mouth daily.     Calcium 600-200 MG-UNIT per tablet  Take 1 tablet by mouth daily.     cefUROXime 500 MG tablet  Commonly known as:  CEFTIN     esomeprazole 40 MG capsule  Commonly known as:  NEXIUM  Take 1 capsule (40 mg total) by mouth 2 (two) times daily before a meal.     ferrous sulfate 325 (65 FE) MG tablet  Take 1 tablet (325 mg total) by mouth 2 (two) times daily with a meal.     glimepiride 4 MG tablet  Commonly known as:  AMARYL  Take 4 mg by mouth daily with breakfast.     hydrochlorothiazide 12.5 MG capsule  Commonly known as:  MICROZIDE  Take 12.5 mg by mouth daily.     levothyroxine 88 MCG tablet  Commonly known as:  SYNTHROID, LEVOTHROID  Take 88 mcg by mouth daily before breakfast.     lisinopril 40 MG tablet  Commonly known as:  PRINIVIL,ZESTRIL  Take 40 mg by mouth daily.     lovastatin 40 MG tablet  Commonly known as:  MEVACOR  Take 40 mg by mouth at bedtime.     metFORMIN 1000 MG tablet  Commonly known as:  GLUCOPHAGE  Take 1,000 mg by mouth 2 (two) times daily with a meal.     multivitamin with minerals Tabs tablet  Take 1 tablet by mouth daily.     ondansetron 4 MG disintegrating tablet  Commonly known as:  ZOFRAN ODT  Take 1 tablet (4 mg total) by mouth every 8 (eight) hours as needed for nausea or vomiting.     ONE TOUCH ULTRA SYSTEM KIT  W/DEVICE Kit  1 kit by Does not apply route once.     oxyCODONE-acetaminophen 5-325 MG per tablet  Commonly known as:  PERCOCET/ROXICET  Take 1 tablet by mouth every 4 (four) hours as needed for moderate pain.     pioglitazone 45 MG tablet  Commonly known as:  ACTOS  Take 45 mg by mouth daily.     Vitamin D3 3000 UNITS Tabs  Take 3,000 Units by mouth daily.           Follow-up Information   Follow up with Maia Petties., MD. Schedule an appointment as soon as possible for a visit in 2 weeks. (post op check)    Specialty:  General Surgery   Contact information:   [place] [address] [phone]       Follow up with [facility], MD In 2 weeks. (follow up anemia)    Specialty:  Family Medicine   Contact information:   [place] [address] [phone]        The results of significant diagnostics from this hospitalization (including imaging, microbiology, ancillary and laboratory) are listed below for reference.    Significant Diagnostic Studies: Dg Chest 2 View  01/14/2014   CLINICAL DATA:  Hypertension.  EXAM: CHEST  2 VIEW  COMPARISON:  July 01, 2010.  FINDINGS: Stable cardiomediastinal silhouette. No acute pulmonary disease is noted. Stable small focus of scarring is seen laterally in left lower lobe. No pleural effusion or pneumothorax is noted. Degenerative changes of mid thoracic spine are noted.  IMPRESSION: No active cardiopulmonary disease.   Electronically Signed   By: Sabino Dick M.D.   On: 01/14/2014 15:40   Dg Cholangiogram Operative  01/16/2014   CLINICAL DATA:  Cholecystitis  EXAM: INTRAOPERATIVE CHOLANGIOGRAM  TECHNIQUE: Cholangiographic images from the C-arm fluoroscopic device were submitted for interpretation post-operatively. Please see the procedural report for the amount of contrast and the fluoroscopy time utilized.  COMPARISON:  None.  FINDINGS: No persistent filling defects in the common duct.  Intrahepatic ducts are incompletely visualized, appearing decompressed centrally. Contrast passes into the duodenum.  : Negative for retained common duct stone.   Electronically Signed   By: Arne [place] M.D.   On: 01/16/2014 10:11   US Abdomen Complete  12/25/2013   CLINICAL DATA:  Early satiety.  Diabetes  EXAM: ULTRASOUND ABDOMEN COMPLETE  COMPARISON:  None.  FINDINGS: Gallbladder:  Multiple small layering gallstones without gallbladder wall thickening. Negative sonographic Murphy sign.  Common bile  duct:  Diameter: 3.0 mm  Liver:  Echogenic liver without focal lesion.  IVC:  Limited  Pancreas:  Limited  Spleen:  Size and appearance within normal limits.  Right Kidney:  Length: 10.0 cm in length. Echogenicity within normal limits. No mass or hydronephrosis visualized.  Left Kidney:  Length: 12.3 cm in length. Echogenicity within normal limits. No mass or hydronephrosis visualized.  Abdominal aorta:  No aneurysm visualized.  Other findings:  Limited by bowel gas  IMPRESSION: Small layering gallstones without biliary obstruction.  Fatty infiltration of the liver.   Electronically Signed   By: Franchot Gallo M.D.   On: 12/25/2013 11:30    Microbiology: No results found for this or any previous visit (from the past 240 hour(s)).   Labs: Basic Metabolic Panel:  Recent Labs Lab 01/14/14 1531 01/17/14 0635 01/17/14 1530 01/18/14 0420  NA 133* 134* 136* 137  K 4.7 4.8 4.5 4.6  CL 92* 96 97 99  CO2 '25 27 26 26  ' GLUCOSE 175* 172* 183* 135*  BUN 41* 43* 42* 38*  CREATININE 1.21* 1.46* 1.40* 1.31*  CALCIUM 9.6 9.0 8.9 8.8   Liver Function Tests:  Recent Labs Lab 01/17/14 0635 01/18/14 0420  AST 77* 50*  ALT 36* 33  ALKPHOS 71 88  BILITOT 0.3 0.6  PROT 6.5 6.1  ALBUMIN 2.5* 2.4*   No results found for this basename: LIPASE, AMYLASE,  in the last 168 hours No results found for this basename: AMMONIA,  in the last 168 hours CBC:  Recent Labs Lab 01/14/14 1531 01/17/14 0635  01/17/14 1557 01/18/14 0149 01/18/14 0420  WBC 11.2* 7.9 8.6 7.8 7.7  HGB 8.5* 6.1* 8.3* 7.4* 7.8*  HCT 26.2* 19.4* 25.6* 22.8* 23.3*  MCV 85.3 85.8 85.3 85.1 85.3  PLT 540* 382 414* 335 377   Cardiac Enzymes: No results found for this basename: CKTOTAL, CKMB, CKMBINDEX, TROPONINI,  in the last 168 hours BNP: BNP (last 3 results) No results found for this basename: PROBNP,  in the last 8760 hours CBG:  Recent Labs Lab 01/17/14 1200 01/17/14 1708 01/17/14 2154 01/18/14 0739 01/18/14 1147  GLUCAP 145* 161* 133* 154* 163*    Active Problems:   Chronic cholecystitis   Signed:  Nayleah Gamel, ANP-BC

## 2014-01-18 NOTE — Progress Notes (Signed)
General surgery:  Patient is doing well. Hemoglobin has started to go back up. No signs of active bleeding clinically. The abdomen is soft. Wounds look good No bowel movement since admission She will go home today after lunch if she tolerates solid diet one dose of MiraLAX ordered   Claire Blake M. Derrell LollingIngram, M.D., [facility] General and Minimally invasive Surgery Breast and Colorectal Surgery Office:   [phone] Pager:   [phone]

## 2014-01-18 NOTE — Discharge Summary (Signed)
Kent Braunschweig M. Mikeala Girdler, MD, FACS General, Bariatric, & Minimally Invasive Surgery [facility]  

## 2014-01-18 NOTE — Progress Notes (Signed)
2 Days Post-Op  Subjective: Tolerating clear liquid diet, no n/v.  Passing flatus, no bm.    Objective: Vital signs in last 24 hours: Temp:  [97.3 F (36.3 C)-100.2 F (37.9 C)] 98.5 F (36.9 C) (02/14 0535) Pulse Rate:  [80-94] 86 (02/14 0535) Resp:  [15-20] 18 (02/14 0535) BP: (100-137)/(32-49) 108/39 mmHg (02/14 0535) SpO2:  [94 %-96 %] 94 % (02/14 0138) Last BM Date: 01/15/14  Intake/Output from previous day: 02/13 0701 - 02/14 0700 In: 2507.7 [P.O.:480; I.V.:1427.7; Blood:600] Out: 1500 [Urine:1500] Intake/Output this shift: Total I/O In: 240 [P.O.:240] Out: -   PE General appearance: alert, cooperative and no distress GI: +bs abdomen is soft, appropriately tender.  incisions are c/d/i, dressings removed, steri-strips in place  Lab Results:   Recent Labs  01/18/14 0149 01/18/14 0420  WBC 7.8 7.7  HGB 7.4* 7.8*  HCT 22.8* 23.3*  PLT 335 377   BMET  Recent Labs  01/17/14 1530 01/18/14 0420  NA 136* 137  K 4.5 4.6  CL 97 99  CO2 26 26  GLUCOSE 183* 135*  BUN 42* 38*  CREATININE 1.40* 1.31*  CALCIUM 8.9 8.8   PT/INR No results found for this basename: LABPROT, INR,  in the last 72 hours ABG No results found for this basename: PHART, PCO2, PO2, HCO3,  in the last 72 hours  Studies/Results: Dg Cholangiogram Operative  01/16/2014   CLINICAL DATA:  Cholecystitis  EXAM: INTRAOPERATIVE CHOLANGIOGRAM  TECHNIQUE: Cholangiographic images from the C-arm fluoroscopic device were submitted for interpretation post-operatively. Please see the procedural report for the amount of contrast and the fluoroscopy time utilized.  COMPARISON:  None.  FINDINGS: No persistent filling defects in the common duct. Intrahepatic ducts are incompletely visualized, appearing decompressed centrally. Contrast passes into the duodenum.  : Negative for retained common duct stone.   Electronically Signed   By: Oley Balmaniel  Hassell M.D.   On: 01/16/2014 10:11     Anti-infectives: Anti-infectives   Start     Dose/Rate Route Frequency Ordered Stop   01/16/14 0600  ceFAZolin (ANCEF) IVPB 2 g/50 mL premix     2 g 100 mL/hr over 30 Minutes Intravenous On call to O.R. 01/15/14 1412 01/16/14 0851      Assessment/Plan: S/p EGD 2/12--no active bleeding, mallory-weiss tear at GE junction Laparoscopic cholecystectomy Dr. Tsuei---01/16/14 GIB  anemia  S/p 2units of PRBCs.  Hgb is stable this am.   High dose PPI over the next 2 weeks Iron supplement. Discharge home today.   LOS: 2 days    Farhaan Mabee ANP-BC 01/18/2014 9:40 AM

## 2014-01-18 NOTE — Progress Notes (Signed)
GASTROENTEROLOGY PROGRESS NOTE  Problem:   1. Mallory-Weiss tear with upper GI bleed 2. Posthemorrhagic anemia  Subjective: Feels well. No nausea or vomiting, no bowel movements. No dysphagia or odynophagia  Objective: Hemoglobin is a bit lower than I would have expected following a 2 unit transfusion. It has dropped since the post transfusion "bump" but has now leveled off and in fact is starting to creep upward.  Her BUN, which is chronically elevated because of chronic kidney disease, is coming down gradually.  Assessment: No evidence of ongoing GI bleeding. Patient at low risk for recurrent bleeding at this point.  Plan: Agree with plans for discharge assuming she tolerates solid food.  No GI followup needed; Mallory-Weiss tears heal quickly on their own.  As previously recommended, I would favor aspirin avoidance for several days, followed by a week or 2 with PPI therapy. No indication for long-term PPI therapy with respect to her Mallory-Weiss tear.  Please call me if you have any questions pertaining to her to her case.  Florencia Reasonsobert V. Cyla Haluska, M.D. 01/18/2014 11:54 AM

## 2014-01-18 NOTE — Discharge Instructions (Signed)

## 2014-01-23 ENCOUNTER — Telehealth (INDEPENDENT_AMBULATORY_CARE_PROVIDER_SITE_OTHER): Payer: Self-pay

## 2014-01-23 NOTE — Telephone Encounter (Signed)
Patient states she her appetite and asking how long should it take,. Advised her appetite  will eventually come back, sometimes it takes 4-6 weeks . Advised for her to call if she has further questiions

## 2014-02-03 ENCOUNTER — Encounter (INDEPENDENT_AMBULATORY_CARE_PROVIDER_SITE_OTHER): Payer: Self-pay | Admitting: Surgery

## 2014-02-03 ENCOUNTER — Ambulatory Visit (INDEPENDENT_AMBULATORY_CARE_PROVIDER_SITE_OTHER): Payer: Medicare Other | Admitting: Surgery

## 2014-02-03 DIAGNOSIS — K8010 Calculus of gallbladder with chronic cholecystitis without obstruction: Secondary | ICD-10-CM

## 2014-02-03 NOTE — Patient Instructions (Signed)
Mallory-Weiss tear

## 2014-02-03 NOTE — Progress Notes (Signed)
Status post laparoscopic cholecystectomy with intraoperative cholangiogram on 01/16/14. Preoperatively, her hemoglobin was low. At the time of surgery, she was found to have gross blood within her stomach. She underwent EGD which revealed a Mallory Weiss tear. The etiology of the Mallory-Weiss tear it is unknown, as she had no vomiting before surgery. She no longer seems to be bleeding. She is on iron supplements. She feels great. Her nausea has resolved. No abdominal pain. Her incisions are all well-healed with no sign of infection. She has regained normal appetite and normal bowel movements other than some slight discoloration from the iron supplements.  She may resume full duty. Followup as needed. Her primary care physician can determine the length of her iron supplementation.  Wilmon ArmsMatthew K. Corliss Skainssuei, MD, [facility]  General/ Trauma Surgery  02/03/2014 2:06 PM

## 2014-09-02 ENCOUNTER — Encounter: Payer: Self-pay | Admitting: *Deleted

## 2014-10-22 ENCOUNTER — Other Ambulatory Visit (HOSPITAL_COMMUNITY): Payer: Self-pay | Admitting: Family Medicine

## 2014-10-22 DIAGNOSIS — Z1231 Encounter for screening mammogram for malignant neoplasm of breast: Secondary | ICD-10-CM

## 2014-11-24 ENCOUNTER — Ambulatory Visit (HOSPITAL_COMMUNITY)
Admission: RE | Admit: 2014-11-24 | Discharge: 2014-11-24 | Disposition: A | Discharge status: Home / Self Care | Payer: Medicare Other | Source: Ambulatory Visit | Attending: Family Medicine | Admitting: Family Medicine

## 2014-11-24 DIAGNOSIS — Z1231 Encounter for screening mammogram for malignant neoplasm of breast: Secondary | ICD-10-CM

## 2014-12-25 ENCOUNTER — Other Ambulatory Visit (HOSPITAL_COMMUNITY): Payer: Self-pay | Admitting: Family Medicine

## 2014-12-25 DIAGNOSIS — M8580 Other specified disorders of bone density and structure, unspecified site: Secondary | ICD-10-CM

## 2015-01-07 ENCOUNTER — Ambulatory Visit (HOSPITAL_COMMUNITY): Payer: Self-pay

## 2015-01-13 ENCOUNTER — Ambulatory Visit (HOSPITAL_COMMUNITY)
Admission: RE | Admit: 2015-01-13 | Discharge: 2015-01-13 | Disposition: A | Discharge status: Home / Self Care | Payer: Medicare Other | Source: Ambulatory Visit | Attending: Family Medicine | Admitting: Family Medicine

## 2015-01-13 DIAGNOSIS — Z13820 Encounter for screening for osteoporosis: Secondary | ICD-10-CM | POA: Insufficient documentation

## 2015-01-13 DIAGNOSIS — M8580 Other specified disorders of bone density and structure, unspecified site: Secondary | ICD-10-CM

## 2015-01-13 DIAGNOSIS — Z780 Asymptomatic menopausal state: Secondary | ICD-10-CM | POA: Insufficient documentation

## 2015-10-14 ENCOUNTER — Other Ambulatory Visit: Payer: Self-pay

## 2015-10-14 DIAGNOSIS — Z1231 Encounter for screening mammogram for malignant neoplasm of breast: Secondary | ICD-10-CM

## 2015-11-26 ENCOUNTER — Ambulatory Visit: Payer: Medicare Other

## 2016-09-20 ENCOUNTER — Ambulatory Visit: Payer: Medicare Other

## 2016-12-01 ENCOUNTER — Ambulatory Visit: Payer: Medicare Other

## 2018-01-12 ENCOUNTER — Other Ambulatory Visit: Payer: Self-pay | Admitting: Family Medicine

## 2018-01-12 DIAGNOSIS — M545 Low back pain, unspecified: Secondary | ICD-10-CM

## 2018-01-15 ENCOUNTER — Other Ambulatory Visit: Payer: Self-pay | Admitting: Family Medicine

## 2018-01-15 DIAGNOSIS — M85852 Other specified disorders of bone density and structure, left thigh: Secondary | ICD-10-CM

## 2018-02-01 ENCOUNTER — Other Ambulatory Visit: Payer: Self-pay | Admitting: Family Medicine

## 2018-02-01 DIAGNOSIS — Z1231 Encounter for screening mammogram for malignant neoplasm of breast: Secondary | ICD-10-CM

## 2018-02-12 ENCOUNTER — Other Ambulatory Visit: Payer: Medicare Other

## 2018-03-09 ENCOUNTER — Ambulatory Visit
Admission: RE | Admit: 2018-03-09 | Discharge: 2018-03-09 | Disposition: A | Discharge status: Home / Self Care | Payer: Medicare Other | Source: Ambulatory Visit | Attending: Family Medicine | Admitting: Family Medicine

## 2018-03-09 DIAGNOSIS — Z1231 Encounter for screening mammogram for malignant neoplasm of breast: Secondary | ICD-10-CM

## 2018-03-09 DIAGNOSIS — M85852 Other specified disorders of bone density and structure, left thigh: Secondary | ICD-10-CM

## 2018-03-17 ENCOUNTER — Ambulatory Visit
Admission: RE | Admit: 2018-03-17 | Discharge: 2018-03-17 | Disposition: A | Discharge status: Home / Self Care | Payer: Medicare Other | Source: Ambulatory Visit | Attending: Family Medicine | Admitting: Family Medicine

## 2018-03-17 DIAGNOSIS — M545 Low back pain, unspecified: Secondary | ICD-10-CM

## 2018-04-17 ENCOUNTER — Ambulatory Visit: Payer: Medicare Other | Attending: Neurosurgery | Admitting: Physical Therapy

## 2018-04-17 ENCOUNTER — Other Ambulatory Visit: Payer: Self-pay

## 2018-04-17 ENCOUNTER — Encounter: Payer: Self-pay | Admitting: Physical Therapy

## 2018-04-17 DIAGNOSIS — M545 Low back pain: Secondary | ICD-10-CM

## 2018-04-17 NOTE — Therapy (Signed)
[facility] [address] [place], [place], [address] Phone: [phone]   Fax:  [phone]  Physical Therapy Evaluation  Patient Details  Name: Claire Blake MRN: [id number] Date of Birth: 11/08/1941 Referring Provider: Julio Sicks, MD   Encounter Date: 04/17/2018  PT End of Session - 04/17/18 0911    Visit Number  1    Number of Visits  12    Date for PT Re-Evaluation  07/10/18    PT Start Time  0911    PT Stop Time  0954    PT Time Calculation (min)  43 min    Activity Tolerance  Patient tolerated treatment well    Behavior During Therapy  [facility] for tasks assessed/performed       Past Medical History:  Diagnosis Date  . Anemia    hx  . Bronchitis    hx  . CKD (chronic kidney disease), stage III (HCC)   . Diabetes mellitus without complication (HCC)   . Glaucoma   . Hyperlipidemia   . Hypertension   . Hypothyroidism   . Obesity   . Pneumonia    hx  . Rheumatic fever    77 yrs old  . Thyroid disease     Past Surgical History:  Procedure Laterality Date  . ABDOMINAL HYSTERECTOMY     partial  . ANKLE FRACTURE SURGERY    . BACK SURGERY  2006   ruptured disc  . CHOLECYSTECTOMY N/A 01/16/2014   Procedure: LAPAROSCOPIC CHOLECYSTECTOMY WITH INTRAOPERATIVE CHOLANGIOGRAM;  Surgeon: Wilmon Arms. Corliss Skains, MD;  Location: [facility] OR;  Service: General;  Laterality: N/A;  . ESOPHAGOGASTRODUODENOSCOPY N/A 01/16/2014   Procedure: ESOPHAGOGASTRODUODENOSCOPY (EGD);  Surgeon: Florencia Reasons, MD;  Location: [facility] ENDOSCOPY;  Service: Endoscopy;  Laterality: N/A;    There were no vitals filed for this visit.   Subjective Assessment - 04/17/18 0913    Subjective  Patient has been having back pain for 4-5 years and it has progressively worsened. She cannot lie on her back. She sleeps in her recliner. Patient reports weakness "like I'm gonna drop".    Pertinent History  HTN, DM, osteopenia. lumbar discectomy 2006 R sided tingling    How long can  you stand comfortably?  2-3 min    How long can you walk comfortably?  2-3 min    Diagnostic tests  mri - L2/3 severe DDD and mild to mod foraminal stenosis Bil    Patient Stated Goals  to get out of pain be able to walk more    Currently in Pain?  Yes    Pain Score  8     Pain Location  Back    Pain Orientation  Lower    Pain Descriptors / Indicators  Aching;Sharp    Pain Type  Chronic pain    Pain Onset  More than a month ago    Aggravating Factors   standing and walking    Pain Relieving Factors  sitting         [facility] PT Assessment - 04/17/18 0001      Assessment   Medical Diagnosis  Lumbar DDD    Referring Provider  Julio Sicks, MD    Onset Date/Surgical Date  04/04/13    Next MD Visit  06/14/18      Precautions   Precautions  None      Restrictions   Weight Bearing Restrictions  No      Balance Screen   Has the patient fallen in the  past 6 months  No    Has the patient had a decrease in activity level because of a fear of falling?   No    Is the patient reluctant to leave their home because of a fear of falling?   No      Home Environment   Living Environment  Private residence single level, no steps    Additional Comments  when does steps or stairs she does step to and needs a rail      Prior Function   Level of Independence  Independent    Vocation  Retired    Leisure  would like to walk       Observation/Other Assessments   Focus on Therapeutic Outcomes (FOTO)   63% limited      Posture/Postural Control   Posture/Postural Control  Postural limitations    Posture Comments  slight depressed R shoulder, tight L QL, forward head, rounded shoulders      ROM / Strength   AROM / PROM / Strength  AROM;Strength      AROM   Overall AROM Comments  Lumbar WNL; flex limited by HS tighness; L ankle restrictions due to previous surgery      Strength   Overall Strength Comments  grossly 5/5 in BLE      Flexibility   Soft Tissue Assessment /Muscle Length  yes     Hamstrings  mild tightness bil    Piriformis  mild left hip tightness      Palpation   Palpation comment  marked tightness of Bil gluteals; tendeness of R glut min                Objective measurements completed on examination: See above findings.      [facility] Adult PT Treatment/Exercise - 04/17/18 0001      Self-Care   Self-Care  Other Self-Care Comments;ADL's    ADL's  sit <>supine transfers    Other Self-Care Comments   MFR using tennis ball or other to release tight gluteals and lumbar musculature             PT Education - 04/17/18 1124    Education provided  Yes    Education Details  self care; HEP     Person(s) Educated  Patient    Methods  Explanation;Demonstration;Verbal cues    Comprehension  Verbalized understanding;Returned demonstration       PT Short Term Goals - 04/17/18 1130      PT SHORT TERM GOAL #1   Title  Patient able to perform sit <>supine transfers independently    Time  3    Period  Weeks    Status  New    Target Date  05/08/18        PT Long Term Goals - 04/17/18 1126      PT LONG TERM GOAL #1   Title  Ind with HEP for flexibility and stabilization    Time  8    Period  Weeks    Status  New      PT LONG TERM GOAL #2   Title  Patient to report improved standing/walking tolerance by 50%.    Time  8    Period  Weeks    Status  New      PT LONG TERM GOAL #3   Title  Patient to report decreased pain by 25% with standing and walking.    Time  8    Period  Weeks  Status  New             Plan - 04/17/18 1131    Clinical Impression Statement  Patient presents today for low complexity evaluation for lumbar DDD. She has mild ROM/flexibilty and strength deficits but pain is her limiting factor. She is limited to 2-3 min of standing and walking. She also sleeps in a recliner becasue she cannot perform sit <>supine transfers independently. Patient was able to peform these transfers in the clinic with VC's. She has  marked tightness in bil gluteals and lumbar spine. She will benefit from PT to address these deficits.    History and Personal Factors relevant to plan of care:  osteopenia, DM, left ankle reconstruction    Clinical Presentation  Stable    Clinical Decision Making  Low    Rehab Potential  Fair    PT Frequency  2x / week    PT Duration  8 weeks    PT Treatment/Interventions  ADLs/Self Care Home Management;Cryotherapy;Electrical Stimulation;Moist Heat;Ultrasound;Neuromuscular re-education;Therapeutic exercise;Therapeutic activities;Patient/family education;Manual techniques;Dry needling    PT Next Visit Plan  manual to bil gluteals/low back; positional traction; sit <> supine transfer training (STG); lumbar stabilization    PT Home Exercise Plan  standing "7" stretch at sink, MFR with ball    Recommended Other Services  yoga    Consulted and Agree with Plan of Care  Patient       Patient will benefit from skilled therapeutic intervention in order to improve the following deficits and impairments:  Pain, Postural dysfunction, Decreased activity tolerance, Decreased strength, Impaired flexibility, Decreased mobility  Visit Diagnosis: Acute bilateral low back pain, with sciatica presence unspecified - Plan: PT plan of care cert/re-cert     Problem List Patient Active Problem List   Diagnosis Date Noted  . Chronic cholecystitis 01/16/2014  . Chronic cholecystitis with calculus 01/10/2014    Solon Palm PT 04/17/2018, 11:47 AM  [facility] [address] [place], [place], [address] Phone: [phone]   Fax:  [phone]  Name: Claire Blake MRN: [id number] Date of Birth: 1941/11/03

## 2018-04-19 ENCOUNTER — Ambulatory Visit: Payer: Medicare Other | Admitting: *Deleted

## 2018-04-19 DIAGNOSIS — M545 Low back pain: Secondary | ICD-10-CM

## 2018-04-19 NOTE — Therapy (Signed)
[facility] [address] [place], [place], [address] Phone: [phone]   Fax:  [phone]  Physical Therapy Treatment  Patient Details  Name: Claire Blake MRN: [id number] Date of Birth: 09/12/1941 Referring Provider: Julio Sicks, MD   Encounter Date: 04/19/2018    Past Medical History:  Diagnosis Date  . Anemia    hx  . Bronchitis    hx  . CKD (chronic kidney disease), stage III (HCC)   . Diabetes mellitus without complication (HCC)   . Glaucoma   . Hyperlipidemia   . Hypertension   . Hypothyroidism   . Obesity   . Pneumonia    hx  . Rheumatic fever    77 yrs old  . Thyroid disease     Past Surgical History:  Procedure Laterality Date  . ABDOMINAL HYSTERECTOMY     partial  . ANKLE FRACTURE SURGERY    . BACK SURGERY  2006   ruptured disc  . CHOLECYSTECTOMY N/A 01/16/2014   Procedure: LAPAROSCOPIC CHOLECYSTECTOMY WITH INTRAOPERATIVE CHOLANGIOGRAM;  Surgeon: Wilmon Arms. Corliss Skains, MD;  Location: [facility] OR;  Service: General;  Laterality: N/A;  . ESOPHAGOGASTRODUODENOSCOPY N/A 01/16/2014   Procedure: ESOPHAGOGASTRODUODENOSCOPY (EGD);  Surgeon: Florencia Reasons, MD;  Location: [facility] ENDOSCOPY;  Service: Endoscopy;  Laterality: N/A;    There were no vitals filed for this visit.  Subjective Assessment - 04/19/18 1754    Subjective   I tried some of the Exs.        Patient has been having back pain for 4-5 years and it has progressively worsened. She cannot lie on her back. She sleeps in her recliner. Patient reports weakness "like I'm gonna drop".    Pertinent History  HTN, DM, osteopenia. lumbar discectomy 2006 R sided tingling    How long can you stand comfortably?  2-3 min    How long can you walk comfortably?  2-3 min    Diagnostic tests  mri - L2/3 severe DDD and mild to mod foraminal stenosis Bil    Patient Stated Goals  to get out of pain be able to walk more    Currently in Pain?  Yes    Pain Score  8     Pain Location  Back     Pain Orientation  Lower    Pain Descriptors / Indicators  Aching;Clance Boll Adult PT Treatment/Exercise - 04/19/18 0001      Modalities   Modalities  Electrical Stimulation;Ultrasound      Insurance claims handler Stimulation Location  LB paras IFC x 15 mins80-150hz  in sitting    Electrical Stimulation Action  sitting    Electrical Stimulation Goals  Pain      Ultrasound   Ultrasound Location  LB paras sitting    Ultrasound Parameters  1.5 w/cm2 x 12 mins    Ultrasound Goals  Pain      Manual Therapy   Manual Therapy  Soft tissue mobilization;Myofascial release    Soft tissue mobilization  STM/ IASTM x to LB paras while pt is sitting and resting on plinth/pillows               PT Short Term Goals - 04/17/18 1130      PT SHORT TERM GOAL #1   Title  Patient able to perform sit <>supine transfers independently    Time  3  Period  Weeks    Status  New    Target Date  05/08/18        PT Long Term Goals - 04/17/18 1126      PT LONG TERM GOAL #1   Title  Ind with HEP for flexibility and stabilization    Time  8    Period  Weeks    Status  New      PT LONG TERM GOAL #2   Title  Patient to report improved standing/walking tolerance by 50%.    Time  8    Period  Weeks    Status  New      PT LONG TERM GOAL #3   Title  Patient to report decreased pain by 25% with standing and walking.    Time  8    Period  Weeks    Status  New            Plan - 04/19/18 1518    Clinical Impression Statement  Pt presented today doing fair, but unable to stand more than 4-5 mins due to increased LBP. Pt.'s Rx was performed in sitting with Pt leaning forward resting on plinth and pillows which was very comfortable for Pt. She did well with [place] combo, STW/IASTM, and Estim with HMP. Biofreeze was also applied to LB end of Rx.Marland Kitchen Pt felt good after Rx.    Rehab Potential  Fair    PT Frequency  2x / week    PT Duration  8  weeks    PT Treatment/Interventions  ADLs/Self Care Home Management;Cryotherapy;Electrical Stimulation;Moist Heat;Ultrasound;Neuromuscular re-education;Therapeutic exercise;Therapeutic activities;Patient/family education;Manual techniques;Dry needling    PT Home Exercise Plan  standing "7" stretch at sink, MFR with ball    Consulted and Agree with Plan of Care  Patient       Patient will benefit from skilled therapeutic intervention in order to improve the following deficits and impairments:  Pain, Postural dysfunction, Decreased activity tolerance, Decreased strength, Impaired flexibility, Decreased mobility  Visit Diagnosis: Acute bilateral low back pain, with sciatica presence unspecified     Problem List Patient Active Problem List   Diagnosis Date Noted  . Chronic cholecystitis 01/16/2014  . Chronic cholecystitis with calculus 01/10/2014    Billee Balcerzak,CHRIS, PTA 04/19/2018, 6:02 PM  [facility] [address] [place], [place], [address] Phone: [phone]   Fax:  [phone]  Name: Claire Blake MRN: [id number] Date of Birth: [birth year]

## 2018-04-25 ENCOUNTER — Encounter: Payer: Self-pay | Admitting: Physical Therapy

## 2018-04-25 ENCOUNTER — Ambulatory Visit: Payer: Medicare Other | Admitting: Physical Therapy

## 2018-04-25 DIAGNOSIS — M545 Low back pain: Secondary | ICD-10-CM | POA: Diagnosis not present

## 2018-04-25 NOTE — Patient Instructions (Addendum)
Pelvic Tilt: Posterior - Legs Bent (Supine)  Tighten stomach andbuttock muscles. Hold _10___ seconds. Relax. Repeat _10-30___ times per set. Do __2__ sets per session. Do _2___ sessions per day.  CAN PERFORM SEATED     Heel Raise (Sitting)    Raise heels, keeping toes on floor. Repeat ___10_ times per set. Do __1-2__ sets per session. Do _1-2___ sessions per day. TIGHT STOMACH AND BUTTOCK MUSCLES with exercise  FLEXION: Sitting (Active)    Sit, both feet flat. Lift right knee toward ceiling Complete _1-3 sets of _10 repetitions. Perform __1-2_ sessions per day.  TIGHT STOMACH AND BUTTOCK MUSCLES with exercise  Brushing Teeth    Place one foot on ledge and one hand on counter. Bend other knee slightly to keep back straight.  Copyright  VHI. All rights reserved.  Refrigerator   Squat with knees apart to reach lower shelves and drawers.   Copyright  VHI. All rights reserved.  Laundry [employer] down and hold basket close to stand. Use leg muscles to do the work.   Copyright  VHI. All rights reserved.  Housework - Vacuuming   Hold the vacuum with arm held at side. Step back and forth to move it, keeping head up. Avoid twisting.   Copyright  VHI. All rights reserved.  Housework - Wiping   Position yourself as close as possible to reach work surface. Avoid straining your back.   Copyright  VHI. All rights reserved.  Gardening - Mowing   Keep arms close to sides and walk with lawn mower.   Copyright  VHI. All rights reserved.  Sleeping on Side   Place pillow between knees. Use cervical support under neck and a roll around waist as needed.   Copyright  VHI. All rights reserved.  Log Roll   Lying on back, bend left knee and place left arm across chest. Roll all in one movement to the right. Reverse to roll to the left. Always move as one unit.   Copyright  VHI. All rights reserved.  Stand to Sit / Sit to Stand   To sit: Bend  knees to lower self onto front edge of chair, then scoot back on seat. To stand: Reverse sequence by placing one foot forward, and scoot to front of seat. Use rocking motion to stand up.  Copyright  VHI. All rights reserved.  Posture - Standing   Good posture is important. Avoid slouching and forward head thrust. Maintain curve in low back and align ears over shoul- ders, hips over ankles.   Copyright  VHI. All rights reserved.  Posture - Sitting   Sit upright, head facing forward. Try using a roll to support lower back. Keep shoulders relaxed, and avoid rounded back. Keep hips level with knees. Avoid crossing legs for long periods.   Copyright  VHI. All rights reserved.  Computer Work   Position work to Art gallery manager. Use proper work and seat height. Keep shoulders back and down, wrists straight, and elbows at right angles. Use chair that provides full back support. Add footrest and lumbar roll as needed.   Copyright  VHI. All rights reserved.

## 2018-04-25 NOTE — Therapy (Signed)
[facility] [address] [place], [place], [address] Phone: [phone]   Fax:  [phone]  Physical Therapy Treatment  Patient Details  Name: Claire Blake MRN: [id number] Date of Birth: 03-17-1941 Referring Provider: Julio Sicks, MD   Encounter Date: 04/25/2018  PT End of Session - 04/25/18 1446    Visit Number  2    Number of Visits  12    Date for PT Re-Evaluation  07/10/18    PT Start Time  1431    PT Stop Time  1514    PT Time Calculation (min)  43 min    Activity Tolerance  Patient tolerated treatment well    Behavior During Therapy  [facility] for tasks assessed/performed       Past Medical History:  Diagnosis Date  . Anemia    hx  . Bronchitis    hx  . CKD (chronic kidney disease), stage III (HCC)   . Diabetes mellitus without complication (HCC)   . Glaucoma   . Hyperlipidemia   . Hypertension   . Hypothyroidism   . Obesity   . Pneumonia    hx  . Rheumatic fever    77 yrs old  . Thyroid disease     Past Surgical History:  Procedure Laterality Date  . ABDOMINAL HYSTERECTOMY     partial  . ANKLE FRACTURE SURGERY    . BACK SURGERY  2006   ruptured disc  . CHOLECYSTECTOMY N/A 01/16/2014   Procedure: LAPAROSCOPIC CHOLECYSTECTOMY WITH INTRAOPERATIVE CHOLANGIOGRAM;  Surgeon: Wilmon Arms. Corliss Skains, MD;  Location: [facility] OR;  Service: General;  Laterality: N/A;  . ESOPHAGOGASTRODUODENOSCOPY N/A 01/16/2014   Procedure: ESOPHAGOGASTRODUODENOSCOPY (EGD);  Surgeon: Florencia Reasons, MD;  Location: [facility] ENDOSCOPY;  Service: Endoscopy;  Laterality: N/A;    There were no vitals filed for this visit.  Subjective Assessment - 04/25/18 1432    Subjective  Patient reported feeling much better after last treatment and two hours of relief    Pertinent History  HTN, DM, osteopenia. lumbar discectomy 2006 R sided tingling    How long can you stand comfortably?  2-3 min    How long can you walk comfortably?  2-3 min    Diagnostic tests  mri -  L2/3 severe DDD and mild to mod foraminal stenosis Bil    Patient Stated Goals  to get out of pain be able to walk more    Currently in Pain?  Yes    Pain Score  8     Pain Location  Back    Pain Orientation  Lower    Pain Descriptors / Indicators  Aching    Pain Type  Chronic pain    Pain Onset  More than a month ago    Aggravating Factors   standing /walking    Pain Relieving Factors  sitting/rest                       [facility] Adult PT Treatment/Exercise - 04/25/18 0001      Electrical Stimulation   Electrical Stimulation Location  LB paras IFC x 15 mins80-150hz  in sitting    Electrical Stimulation Action  sitting    Electrical Stimulation Goals  Pain      Ultrasound   Ultrasound Location  LB paraspinals sitting    Ultrasound Parameters  1.5w/cm2/50%/57mhz x44min    Ultrasound Goals  Pain      Manual Therapy   Manual Therapy  Soft tissue mobilization;Myofascial release  Soft tissue mobilization  manual STM/ IASTM x to LB paras while pt is sitting and resting on plinth/pillows             PT Education - 04/25/18 1513    Education provided  Yes    Education Details  posture techniques /draw ins    Person(s) Educated  Patient    Methods  Explanation;Demonstration;Handout    Comprehension  Verbalized understanding;Returned demonstration       PT Short Term Goals - 04/25/18 1508      PT SHORT TERM GOAL #1   Title  Patient able to perform sit <>supine transfers independently    Time  3    Period  Weeks    Status  On-going        PT Long Term Goals - 04/25/18 1508      PT LONG TERM GOAL #1   Title  Ind with HEP for flexibility and stabilization    Time  8    Period  Weeks    Status  On-going      PT LONG TERM GOAL #2   Title  Patient to report improved standing/walking tolerance by 50%.    Time  8    Period  Weeks    Status  On-going      PT LONG TERM GOAL #3   Title  Patient to report decreased pain by 25% with standing and walking.     Time  8    Period  Weeks    Status  On-going            Plan - 04/25/18 1514    Clinical Impression Statement  Patient tolerated treatment well today. Patient continues to have difficulty with prolong standing. Patient was educated on posture awareness tecniques and draw ins in seated position today with HEP provided. Patient has had a great response after STW thus far. Patient goals ongoing at this time.     Rehab Potential  Fair    PT Frequency  2x / week    PT Duration  8 weeks    PT Treatment/Interventions  ADLs/Self Care Home Management;Cryotherapy;Electrical Stimulation;Moist Heat;Ultrasound;Neuromuscular re-education;Therapeutic exercise;Therapeutic activities;Patient/family education;Manual techniques;Dry needling    PT Next Visit Plan  cont with POC manual to bil gluteals/low back sit <> supine transfer training (STG); lumbar stabilization/ may try nustep and standing tolerance per patient     Consulted and Agree with Plan of Care  Patient       Patient will benefit from skilled therapeutic intervention in order to improve the following deficits and impairments:  Pain, Postural dysfunction, Decreased activity tolerance, Decreased strength, Impaired flexibility, Decreased mobility  Visit Diagnosis: Acute bilateral low back pain, with sciatica presence unspecified     Problem List Patient Active Problem List   Diagnosis Date Noted  . Chronic cholecystitis 01/16/2014  . Chronic cholecystitis with calculus 01/10/2014    Edan Serratore P, PTA 04/25/2018, 3:18 PM  [facility] [address] [place], [place], [address] Phone: [phone]   Fax:  [phone]  Name: Claire Blake MRN: [id number] Date of Birth: [birth year]

## 2018-05-01 ENCOUNTER — Encounter: Payer: Self-pay | Admitting: Physical Therapy

## 2018-05-01 ENCOUNTER — Ambulatory Visit: Payer: Medicare Other | Admitting: Physical Therapy

## 2018-05-01 DIAGNOSIS — M545 Low back pain: Secondary | ICD-10-CM | POA: Diagnosis not present

## 2018-05-01 NOTE — Therapy (Signed)
[facility] [address] [place], [place], [address] Phone: [phone]   Fax:  [phone]  Physical Therapy Treatment  Patient Details  Name: Claire Blake MRN: [id number] Date of Birth: 1941-11-30 Referring Provider: Julio Sicks, MD   Encounter Date: 05/01/2018  PT End of Session - 05/01/18 1433    Visit Number  3    Number of Visits  12    Date for PT Re-Evaluation  07/10/18    PT Start Time  1433    PT Stop Time  1518    PT Time Calculation (min)  45 min    Activity Tolerance  Patient tolerated treatment well    Behavior During Therapy  [facility] for tasks assessed/performed       Past Medical History:  Diagnosis Date  . Anemia    hx  . Bronchitis    hx  . CKD (chronic kidney disease), stage III (HCC)   . Diabetes mellitus without complication (HCC)   . Glaucoma   . Hyperlipidemia   . Hypertension   . Hypothyroidism   . Obesity   . Pneumonia    hx  . Rheumatic fever    77 yrs old  . Thyroid disease     Past Surgical History:  Procedure Laterality Date  . ABDOMINAL HYSTERECTOMY     partial  . ANKLE FRACTURE SURGERY    . BACK SURGERY  2006   ruptured disc  . CHOLECYSTECTOMY N/A 01/16/2014   Procedure: LAPAROSCOPIC CHOLECYSTECTOMY WITH INTRAOPERATIVE CHOLANGIOGRAM;  Surgeon: Wilmon Arms. Corliss Skains, MD;  Location: [facility] OR;  Service: General;  Laterality: N/A;  . ESOPHAGOGASTRODUODENOSCOPY N/A 01/16/2014   Procedure: ESOPHAGOGASTRODUODENOSCOPY (EGD);  Surgeon: Florencia Reasons, MD;  Location: [facility] ENDOSCOPY;  Service: Endoscopy;  Laterality: N/A;    There were no vitals filed for this visit.  Subjective Assessment - 05/01/18 1430    Subjective  Reports that she has relief for 2 hours after PT and then back to normal pain. Reports compliance with HEP.    Pertinent History  HTN, DM, osteopenia. lumbar discectomy 2006 R sided tingling    How long can you stand comfortably?  2-3 min    How long can you walk comfortably?  2-3 min     Diagnostic tests  mri - L2/3 severe DDD and mild to mod foraminal stenosis Bil    Patient Stated Goals  to get out of pain be able to walk more    Currently in Pain?  Yes    Pain Score  2     Pain Location  Back    Pain Orientation  Lower    Pain Descriptors / Indicators  Aching    Pain Type  Chronic pain    Pain Onset  More than a month ago    Pain Frequency  Intermittent    Aggravating Factors   Standing, walking    Pain Relieving Factors  Sitting, rest         [facility] PT Assessment - 05/01/18 0001      Assessment   Medical Diagnosis  Lumbar DDD    Onset Date/Surgical Date  04/04/13    Next MD Visit  06/14/18      Precautions   Precautions  None      Restrictions   Weight Bearing Restrictions  No                   [facility] Adult PT Treatment/Exercise - 05/01/18 0001  Modalities   Modalities  Electrical Stimulation;Moist Heat;Ultrasound      Moist Heat Therapy   Number Minutes Moist Heat  15 Minutes    Moist Heat Location  Lumbar Spine      Electrical Stimulation   Electrical Stimulation Location  B lumbar paraspinals    Electrical Stimulation Action  IFC    Electrical Stimulation Parameters  80-150 hz x15 min    Electrical Stimulation Goals  Pain;Tone      Ultrasound   Ultrasound Location  B lumbar paraspinals    Ultrasound Parameters  1.5 w/cm2, 100%, 1 mhz x10 min    Ultrasound Goals  Pain      Manual Therapy   Manual Therapy  Soft tissue mobilization    Soft tissue mobilization  STW to B lumbar paraspinals to reduce muscle tightness and pain               PT Short Term Goals - 04/25/18 1508      PT SHORT TERM GOAL #1   Title  Patient able to perform sit <>supine transfers independently    Time  3    Period  Weeks    Status  On-going        PT Long Term Goals - 05/01/18 1533      PT LONG TERM GOAL #1   Title  Ind with HEP for flexibility and stabilization    Time  8    Period  Weeks    Status  Achieved      PT LONG  TERM GOAL #2   Title  Patient to report improved standing/walking tolerance by 50%.    Time  8    Period  Weeks    Status  On-going      PT LONG TERM GOAL #3   Title  Patient to report decreased pain by 25% with standing and walking.    Time  8    Period  Weeks    Status  On-going            Plan - 05/01/18 1529    Clinical Impression Statement  Patient presented in clinic with reports of continued pain in low back with standing or walking which is relieved immediately upon sitting per patient report. Patient compliant with HEP with further education regarding rationale for exercises as well as posture techniques. Increased muscle tightness palpated bilaterally along lumbar spine and minimally tender per patient report. Normal modalities response noted following removal of the modalities.    Rehab Potential  Fair    PT Frequency  2x / week    PT Duration  8 weeks    PT Treatment/Interventions  ADLs/Self Care Home Management;Cryotherapy;Electrical Stimulation;Moist Heat;Ultrasound;Neuromuscular re-education;Therapeutic exercise;Therapeutic activities;Patient/family education;Manual techniques;Dry needling    PT Next Visit Plan  cont with POC manual to bil gluteals/low back sit <> supine transfer training (STG); lumbar stabilization/ may try nustep and standing tolerance per patient     PT Home Exercise Plan  standing "7" stretch at sink, MFR with ball    Consulted and Agree with Plan of Care  Patient       Patient will benefit from skilled therapeutic intervention in order to improve the following deficits and impairments:  Pain, Postural dysfunction, Decreased activity tolerance, Decreased strength, Impaired flexibility, Decreased mobility  Visit Diagnosis: Acute bilateral low back pain, with sciatica presence unspecified     Problem List Patient Active Problem List   Diagnosis Date Noted  . Chronic cholecystitis 01/16/2014  .  Chronic cholecystitis with calculus 01/10/2014     Marvell Fuller, PTA 05/01/2018, 3:35 PM  [facility] [address] [place], [place], [address] Phone: [phone]   Fax:  [phone]  Name: Claire Blake MRN: [id number] Date of Birth: [birth year]

## 2018-05-03 ENCOUNTER — Encounter: Payer: Self-pay | Admitting: Physical Therapy

## 2018-05-03 ENCOUNTER — Ambulatory Visit: Payer: Medicare Other | Admitting: Physical Therapy

## 2018-05-03 DIAGNOSIS — M545 Low back pain: Secondary | ICD-10-CM

## 2018-05-03 NOTE — Therapy (Signed)
[facility] [address] [place], [place], [address] Phone: [phone]   Fax:  [phone]  Physical Therapy Treatment  Patient Details  Name: Claire Blake MRN: [id number] Date of Birth: 01-25-1941 Referring Provider: Julio Sicks, MD   Encounter Date: 05/03/2018  PT End of Session - 05/03/18 1446    Visit Number  4    Number of Visits  12    Date for PT Re-Evaluation  07/10/18    PT Start Time  1430    PT Stop Time  1515    PT Time Calculation (min)  45 min    Activity Tolerance  Patient tolerated treatment well    Behavior During Therapy  [facility] for tasks assessed/performed       Past Medical History:  Diagnosis Date  . Anemia    hx  . Bronchitis    hx  . CKD (chronic kidney disease), stage III (HCC)   . Diabetes mellitus without complication (HCC)   . Glaucoma   . Hyperlipidemia   . Hypertension   . Hypothyroidism   . Obesity   . Pneumonia    hx  . Rheumatic fever    77 yrs old  . Thyroid disease     Past Surgical History:  Procedure Laterality Date  . ABDOMINAL HYSTERECTOMY     partial  . ANKLE FRACTURE SURGERY    . BACK SURGERY  2006   ruptured disc  . CHOLECYSTECTOMY N/A 01/16/2014   Procedure: LAPAROSCOPIC CHOLECYSTECTOMY WITH INTRAOPERATIVE CHOLANGIOGRAM;  Surgeon: Wilmon Arms. Corliss Skains, MD;  Location: [facility] OR;  Service: General;  Laterality: N/A;  . ESOPHAGOGASTRODUODENOSCOPY N/A 01/16/2014   Procedure: ESOPHAGOGASTRODUODENOSCOPY (EGD);  Surgeon: Florencia Reasons, MD;  Location: [facility] ENDOSCOPY;  Service: Endoscopy;  Laterality: N/A;    There were no vitals filed for this visit.  Subjective Assessment - 05/03/18 1434    Subjective  Patient continues to report relief after therapy    Pertinent History  HTN, DM, osteopenia. lumbar discectomy 2006 R sided tingling    How long can you stand comfortably?  2-3 min    How long can you walk comfortably?  2-3 min    Diagnostic tests  mri - L2/3 severe DDD and mild to mod  foraminal stenosis Bil    Patient Stated Goals  to get out of pain be able to walk more    Currently in Pain?  Yes    Pain Score  -- 0/10 to 5/10    Pain Location  Back    Pain Orientation  Lower    Pain Descriptors / Indicators  Aching;Discomfort    Pain Type  Chronic pain    Pain Onset  More than a month ago    Pain Frequency  Intermittent    Aggravating Factors   any weigh tbearing (walking or standing)    Pain Relieving Factors  sitting or rest                       [facility] Adult PT Treatment/Exercise - 05/03/18 0001      Exercises   Exercises  Lumbar      Lumbar Exercises: Aerobic   Nustep  x48min L3 UE/LE      Lumbar Exercises: Seated   Other Seated Lumbar Exercises  seated heel lifts and marching with draw ins 2x10    Other Seated Lumbar Exercises  seated rows and lat pull with yellow t-band 2x10      Moist Heat  Therapy   Number Minutes Moist Heat  10 Minutes    Moist Heat Location  Lumbar Spine      Electrical Stimulation   Electrical Stimulation Location  B lumbar paraspinals    Electrical Stimulation Action  IFC    Electrical Stimulation Parameters  80-150hz  x42min    Electrical Stimulation Goals  Pain;Tone      Manual Therapy   Manual Therapy  Soft tissue mobilization;Myofascial release    Soft tissue mobilization  manual STW/MFR to B lumbar paraspinals to reduce muscle tightness and pain             PT Education - 05/03/18 1512    Education provided  Yes    Education Details  HEP    Person(s) Educated  Patient    Methods  Explanation;Demonstration;Handout    Comprehension  Verbalized understanding;Returned demonstration       PT Short Term Goals - 04/25/18 1508      PT SHORT TERM GOAL #1   Title  Patient able to perform sit <>supine transfers independently    Time  3    Period  Weeks    Status  On-going        PT Long Term Goals - 05/01/18 1533      PT LONG TERM GOAL #1   Title  Ind with HEP for flexibility and  stabilization    Time  8    Period  Weeks    Status  Achieved      PT LONG TERM GOAL #2   Title  Patient to report improved standing/walking tolerance by 50%.    Time  8    Period  Weeks    Status  On-going      PT LONG TERM GOAL #3   Title  Patient to report decreased pain by 25% with standing and walking.    Time  8    Period  Weeks    Status  On-going            Plan - 05/03/18 1512    Clinical Impression Statement  Patient tolerated treatment well today. Patient has relief thus far after treatments and has been doing HEP daily per reported. Patient continues to have increased pain with any weightbearing even a short walk. Today started seated exercises for core progression with HEP provided. Patient goals ongoing.     Rehab Potential  Fair    PT Frequency  2x / week    PT Duration  8 weeks    PT Treatment/Interventions  ADLs/Self Care Home Management;Cryotherapy;Electrical Stimulation;Moist Heat;Ultrasound;Neuromuscular re-education;Therapeutic exercise;Therapeutic activities;Patient/family education;Manual techniques;Dry needling    PT Next Visit Plan  cont with POC manual to bil gluteals/low back sit <> supine transfer training (STG); lumbar stabilization/ progress seated to standing core progression slowly    Consulted and Agree with Plan of Care  Patient       Patient will benefit from skilled therapeutic intervention in order to improve the following deficits and impairments:  Pain, Postural dysfunction, Decreased activity tolerance, Decreased strength, Impaired flexibility, Decreased mobility  Visit Diagnosis: Acute bilateral low back pain, with sciatica presence unspecified     Problem List Patient Active Problem List   Diagnosis Date Noted  . Chronic cholecystitis 01/16/2014  . Chronic cholecystitis with calculus 01/10/2014    Latroy Gaymon P, PTA 05/03/2018, 3:16 PM  [facility] [address] [place], [place], [address] Phone: [phone]   Fax:  [phone]  Name: Claire Struve  Blake MRN: [id number] Date of Birth: [birth year]

## 2018-05-03 NOTE — Patient Instructions (Signed)
   Scapular Retraction: Bilateral  Facing anchor, pull arms back, bringing shoulder blades together. Repeat _30___ times per set. Do __2-3__ sets per session. Do _2___ sessions per day.    Standing lat pull with theraband CAN PERFORM SEATED  Anchor bands higher than your head. Start with your arms straight out in front of you at shoulder height (or a little above).  Pull bands down next to your body and then slowly return to the starting position.  10-30 x1day

## 2018-05-09 ENCOUNTER — Encounter: Payer: Self-pay | Admitting: Physical Therapy

## 2018-05-09 ENCOUNTER — Ambulatory Visit: Payer: Medicare Other | Attending: Neurosurgery | Admitting: Physical Therapy

## 2018-05-09 DIAGNOSIS — M545 Low back pain: Secondary | ICD-10-CM | POA: Diagnosis not present

## 2018-05-09 NOTE — Therapy (Signed)
[facility] [address] [place], [place], [address] Phone: [phone]   Fax:  [phone]  Physical Therapy Treatment  Patient Details  Name: Claire DeterMaxine M Blake MRN: [id number] Date of Birth: 02/19/1941 Referring Provider: Julio SicksHenry Pool, MD   Encounter Date: 05/09/2018  PT End of Session - 05/09/18 1437    Visit Number  5    Number of Visits  12    Date for PT Re-Evaluation  07/10/18    PT Start Time  1358    PT Stop Time  1452    PT Time Calculation (min)  54 min    Activity Tolerance  Patient tolerated treatment well    Behavior During Therapy  [facility] for tasks assessed/performed       Past Medical History:  Diagnosis Date  . Anemia    hx  . Bronchitis    hx  . CKD (chronic kidney disease), stage III (HCC)   . Diabetes mellitus without complication (HCC)   . Glaucoma   . Hyperlipidemia   . Hypertension   . Hypothyroidism   . Obesity   . Pneumonia    hx  . Rheumatic fever    77 yrs old  . Thyroid disease     Past Surgical History:  Procedure Laterality Date  . ABDOMINAL HYSTERECTOMY     partial  . ANKLE FRACTURE SURGERY    . BACK SURGERY  2006   ruptured disc  . CHOLECYSTECTOMY N/A 01/16/2014   Procedure: LAPAROSCOPIC CHOLECYSTECTOMY WITH INTRAOPERATIVE CHOLANGIOGRAM;  Surgeon: Wilmon ArmsMatthew K. Corliss Skainssuei, MD;  Location: [facility] OR;  Service: General;  Laterality: N/A;  . ESOPHAGOGASTRODUODENOSCOPY N/A 01/16/2014   Procedure: ESOPHAGOGASTRODUODENOSCOPY (EGD);  Surgeon: Florencia Reasonsobert V Buccini, MD;  [facility];  Service: Endoscopy;  Laterality: N/A;    There were no vitals filed for this visit.  Subjective Assessment - 05/09/18 1359    Subjective  Patient reported relief after treatment yet discomfort returns    Pertinent History  HTN, DM, osteopenia. lumbar discectomy 2006 R sided tingling    How long can you stand comfortably?  2-3 min    How long can you walk comfortably?  2-3 min    Diagnostic tests  mri - L2/3 severe DDD and  mild to mod foraminal stenosis Bil    Patient Stated Goals  to get out of pain be able to walk more    Currently in Pain?  Yes    Pain Score  5     Pain Location  Back    Pain Orientation  Lower    Pain Descriptors / Indicators  Aching    Pain Onset  More than a month ago    Pain Frequency  Intermittent    Aggravating Factors   any weight bearing    Pain Relieving Factors  sitting                       [facility] Adult PT Treatment/Exercise - 05/09/18 0001      Lumbar Exercises: Aerobic   Nustep  x158min L3 UE/LE      Lumbar Exercises: Standing   Heel Raises  10 reps;2 seconds    Other Standing Lumbar Exercises  weight shifting 2min, marching 2x10, 8" toe taps 2x10, side to side stepping x8      Moist Heat Therapy   Number Minutes Moist Heat  10 Minutes    Moist Heat Location  Lumbar Spine      Electrical Stimulation  Electrical Stimulation Location  B lumbar paraspinals    Electrical Stimulation Action  IFC    Electrical Stimulation Parameters  80-150hz  x64min    Electrical Stimulation Goals  Pain;Tone      Manual Therapy   Manual Therapy  Soft tissue mobilization;Myofascial release    Soft tissue mobilization  manual STW/MFR to B lumbar paraspinals to reduce muscle tightness and pain               PT Short Term Goals - 04/25/18 1508      PT SHORT TERM GOAL #1   Title  Patient able to perform sit <>supine transfers independently    Time  3    Period  Weeks    Status  On-going        PT Long Term Goals - 05/01/18 1533      PT LONG TERM GOAL #1   Title  Ind with HEP for flexibility and stabilization    Time  8    Period  Weeks    Status  Achieved      PT LONG TERM GOAL #2   Title  Patient to report improved standing/walking tolerance by 50%.    Time  8    Period  Weeks    Status  On-going      PT LONG TERM GOAL #3   Title  Patient to report decreased pain by 25% with standing and walking.    Time  8    Period  Weeks    Status   On-going            Plan - 05/09/18 1439    Clinical Impression Statement  Patient tolerated treatment well today and able to perform of standing exercises today with no rest break. Focused on core activation with all exercises followed by STW to low back paraspinals and H/ES. Patient has reported ongoing pain and no consistant relief at this time. Goals ongoing.     Rehab Potential  Fair    PT Frequency  2x / week    PT Duration  8 weeks    PT Treatment/Interventions  ADLs/Self Care Home Management;Cryotherapy;Electrical Stimulation;Moist Heat;Ultrasound;Neuromuscular re-education;Therapeutic exercise;Therapeutic activities;Patient/family education;Manual techniques;Dry needling    PT Next Visit Plan  cont with POC manual to bil gluteals/low back sit <> supine transfer training (STG); lumbar stabilization/ progress seated to standing core progression slowly    Consulted and Agree with Plan of Care  Patient       Patient will benefit from skilled therapeutic intervention in order to improve the following deficits and impairments:  Pain, Postural dysfunction, Decreased activity tolerance, Decreased strength, Impaired flexibility, Decreased mobility  Visit Diagnosis: Acute bilateral low back pain, with sciatica presence unspecified     Problem List Patient Active Problem List   Diagnosis Date Noted  . Chronic cholecystitis 01/16/2014  . Chronic cholecystitis with calculus 01/10/2014    Claire Blake, Claire Blake 05/09/2018, 2:55 PM  [facility] [address] [place], [place], [address] Phone: [phone]   Fax:  [phone]  Name: Claire Blake MRN: [id number] Date of Birth: 1941/10/21

## 2018-05-11 ENCOUNTER — Encounter: Payer: Medicare Other | Admitting: Physical Therapy

## 2018-05-16 ENCOUNTER — Encounter: Payer: Medicare Other | Admitting: Physical Therapy

## 2018-05-17 ENCOUNTER — Encounter: Payer: Medicare Other | Admitting: Physical Therapy

## 2018-05-21 ENCOUNTER — Encounter: Payer: Medicare Other | Admitting: Physical Therapy

## 2018-05-23 ENCOUNTER — Ambulatory Visit: Payer: Medicare Other | Admitting: Physical Therapy

## 2018-05-23 ENCOUNTER — Encounter: Payer: Self-pay | Admitting: Physical Therapy

## 2018-05-23 DIAGNOSIS — M545 Low back pain: Secondary | ICD-10-CM | POA: Diagnosis not present

## 2018-05-23 NOTE — Therapy (Signed)
[facility] [address] [place], [place], [address] Phone: [phone]   Fax:  [phone]  Physical Therapy Treatment  Patient Details  Name: Claire Blake MRN: [id number] Date of Birth: 1941-08-15 Referring Provider: Julio Sicks, MD   Encounter Date: 05/23/2018  PT End of Session - 05/23/18 0921    Visit Number  6    Number of Visits  12    Date for PT Re-Evaluation  07/10/18    PT Start Time  0837    PT Stop Time  0934    PT Time Calculation (min)  57 min    Activity Tolerance  Patient tolerated treatment well    Behavior During Therapy  [facility] for tasks assessed/performed       Past Medical History:  Diagnosis Date  . Anemia    hx  . Bronchitis    hx  . CKD (chronic kidney disease), stage III (HCC)   . Diabetes mellitus without complication (HCC)   . Glaucoma   . Hyperlipidemia   . Hypertension   . Hypothyroidism   . Obesity   . Pneumonia    hx  . Rheumatic fever    77 yrs old  . Thyroid disease     Past Surgical History:  Procedure Laterality Date  . ABDOMINAL HYSTERECTOMY     partial  . ANKLE FRACTURE SURGERY    . BACK SURGERY  2006   ruptured disc  . CHOLECYSTECTOMY N/A 01/16/2014   Procedure: LAPAROSCOPIC CHOLECYSTECTOMY WITH INTRAOPERATIVE CHOLANGIOGRAM;  Surgeon: Wilmon Arms. Corliss Skains, MD;  Location: [facility] OR;  Service: General;  Laterality: N/A;  . ESOPHAGOGASTRODUODENOSCOPY N/A 01/16/2014   Procedure: ESOPHAGOGASTRODUODENOSCOPY (EGD);  Surgeon: Florencia Reasons, MD;  [facility]COPY;  Service: Endoscopy;  Laterality: N/A;    There were no vitals filed for this visit.  Subjective Assessment - 05/23/18 0839    Subjective  Patient doing well with last treatment and some fatigue due to bronchitis    Pertinent History  HTN, DM, osteopenia. lumbar discectomy 2006 R sided tingling    How long can you stand comfortably?  2-3 min    How long can you walk comfortably?  2-3 min    Diagnostic tests  mri - L2/3  severe DDD and mild to mod foraminal stenosis Bil    Patient Stated Goals  to get out of pain be able to walk more    Currently in Pain?  Yes    Pain Score  5     Pain Location  Back    Pain Orientation  Lower    Pain Descriptors / Indicators  Aching    Pain Type  Chronic pain    Pain Onset  More than a month ago    Pain Frequency  Intermittent    Aggravating Factors   any weight bearing    Pain Relieving Factors  sitting                       [facility] Adult PT Treatment/Exercise - 05/23/18 0001      Lumbar Exercises: Aerobic   Nustep  x10 min L3 UE/LE monitored for progression      Lumbar Exercises: Standing   Heel Raises  10 reps;2 seconds    Other Standing Lumbar Exercises  weight shifting 3,min, marching 2x10, side stepping x63min      Moist Heat Therapy   Number Minutes Moist Heat  15 Minutes    Moist Heat Location  Lumbar  Spine      Programme researcher, broadcasting/film/videolectrical Stimulation   Electrical Stimulation Location  B lumbar paraspinals    Electrical Stimulation Action  IFC    Electrical Stimulation Parameters  80-150hz  x8715min    Electrical Stimulation Goals  Pain;Tone      Manual Therapy   Manual Therapy  Soft tissue mobilization;Myofascial release    Soft tissue mobilization  manual STW/MFR to B lumbar paraspinals to reduce muscle tightness and pain               PT Short Term Goals - 04/25/18 1508      PT SHORT TERM GOAL #1   Title  Patient able to perform sit <>supine transfers independently    Time  3    Period  Weeks    Status  On-going        PT Long Term Goals - 05/01/18 1533      PT LONG TERM GOAL #1   Title  Ind with HEP for flexibility and stabilization    Time  8    Period  Weeks    Status  Achieved      PT LONG TERM GOAL #2   Title  Patient to report improved standing/walking tolerance by 50%.    Time  8    Period  Weeks    Status  On-going      PT LONG TERM GOAL #3   Title  Patient to report decreased pain by 25% with standing and walking.     Time  8    Period  Weeks    Status  On-going            Plan - 05/23/18 [id number]    Clinical Impression Statement  Patient tolerated treatment well today. Patient reported some fatigue o to ongoing bronchitis yet able to complete all exercises today. Patient able to increase nustep activity tolerance with 2 min more and able to perform standing activities up to 9 min today. Patient reported up to6/10 back discomfort yet relief when sitting. Patient continues to have palpable tightness and pain in low back. goals progressing at this time.     Rehab Potential  Fair    PT Frequency  2x / week    PT Duration  8 weeks    PT Treatment/Interventions  ADLs/Self Care Home Management;Cryotherapy;Electrical Stimulation;Moist Heat;Ultrasound;Neuromuscular re-education;Therapeutic exercise;Therapeutic activities;Patient/family education;Manual techniques;Dry needling    PT Next Visit Plan  cont with POC manual to bil gluteals/low back sit <> supine transfer training (STG); lumbar stabilization/ progress seated to standing core progression slowly    Consulted and Agree with Plan of Care  Patient       Patient will benefit from skilled therapeutic intervention in order to improve the following deficits and impairments:  Pain, Postural dysfunction, Decreased activity tolerance, Decreased strength, Impaired flexibility, Decreased mobility  Visit Diagnosis: Acute bilateral low back pain, with sciatica presence unspecified     Problem List Patient Active Problem List   Diagnosis Date Noted  . Chronic cholecystitis 01/16/2014  . Chronic cholecystitis with calculus 01/10/2014    Doyle Tegethoff P, PTA 05/23/2018, 9:38 AM  [facility] [address] [place], [place], [address] Phone: [phone]   Fax:  [phone]  Name: Claire Blake MRN: [id number] Date of Birth: 10/08/1941

## 2018-05-24 ENCOUNTER — Encounter: Payer: Self-pay | Admitting: Physical Therapy

## 2018-05-24 ENCOUNTER — Ambulatory Visit: Payer: Medicare Other | Admitting: Physical Therapy

## 2018-05-24 DIAGNOSIS — M545 Low back pain: Secondary | ICD-10-CM | POA: Diagnosis not present

## 2018-05-24 NOTE — Therapy (Addendum)
[facility] [place], [place], [address] Phone: [phone]   Fax:  [phone]  Physical Therapy Treatment  Patient Details  Name: Claire Blake MRN: [id number] Date of Birth: Apr 10, 1941 Referring Provider: Earnie Larsson, MD   Encounter Date: 05/24/2018  PT End of Session - 05/24/18 1358    Visit Number  7    Number of Visits  12    Date for PT Re-Evaluation  07/10/18    PT Start Time  4742    PT Stop Time  1430    PT Time Calculation (min)  38 min    Activity Tolerance  Patient tolerated treatment well;Other (comment) limited due to SOB    Behavior During Therapy  [facility] for tasks assessed/performed       Past Medical History:  Diagnosis Date  . Anemia    hx  . Bronchitis    hx  . CKD (chronic kidney disease), stage III ([place])   . Diabetes mellitus without complication ([place])   . Glaucoma   . Hyperlipidemia   . Hypertension   . Hypothyroidism   . Obesity   . Pneumonia    hx  . Rheumatic fever    77 yrs old  . Thyroid disease     Past Surgical History:  Procedure Laterality Date  . ABDOMINAL HYSTERECTOMY     partial  . ANKLE FRACTURE SURGERY    . BACK SURGERY  2006   ruptured disc  . CHOLECYSTECTOMY N/A 01/16/2014   Procedure: LAPAROSCOPIC CHOLECYSTECTOMY WITH INTRAOPERATIVE CHOLANGIOGRAM;  Surgeon: Imogene Burn. Georgette Dover, MD;  Location: [place];  Service: General;  Laterality: N/A;  . ESOPHAGOGASTRODUODENOSCOPY N/A 01/16/2014   Procedure: ESOPHAGOGASTRODUODENOSCOPY (EGD);  Surgeon: Cleotis Nipper, MD;  Location: [facility] ENDOSCOPY;  Service: Endoscopy;  Laterality: N/A;    There were no vitals filed for this visit.  Subjective Assessment - 05/24/18 1356    Subjective  No new complaints today, some ongoing SOB from bronchitis    Pertinent History  HTN, DM, osteopenia. lumbar discectomy 2006 R sided tingling    How long can you stand comfortably?  2-3 min    How long can you walk comfortably?  2-3 min    Diagnostic  tests  mri - L2/3 severe DDD and mild to mod foraminal stenosis Bil    Patient Stated Goals  to get out of pain be able to walk more    Currently in Pain?  Yes    Pain Score  5     Pain Orientation  Lower    Pain Descriptors / Indicators  Aching    Pain Type  Chronic pain    Pain Onset  More than a month ago    Pain Frequency  Intermittent    Aggravating Factors   any standing    Pain Relieving Factors  sitting                       [facility] Adult PT Treatment/Exercise - 05/24/18 0001      Lumbar Exercises: Aerobic   Nustep  x7 min L3 UE/LE monitored for progression      Moist Heat Therapy   Number Minutes Moist Heat  10 Minutes    Moist Heat Location  Lumbar Spine      Electrical Stimulation   Electrical Stimulation Location  B lumbar paraspinals    Electrical Stimulation Action  IFC    Electrical Stimulation Parameters  80-150hz x16mn    Electrical  Stimulation Goals  Pain;Tone      Manual Therapy   Manual Therapy  Soft tissue mobilization;Myofascial release    Soft tissue mobilization  manual STW/MFR to B lumbar paraspinals to reduce muscle tightness and pain               PT Short Term Goals - 04/25/18 1508      PT SHORT TERM GOAL #1   Title  Patient able to perform sit <>supine transfers independently    Time  3    Period  Weeks    Status  On-going        PT Long Term Goals - 05/01/18 1533      PT LONG TERM GOAL #1   Title  Ind with HEP for flexibility and stabilization    Time  8    Period  Weeks    Status  Achieved      PT LONG TERM GOAL #2   Title  Patient to report improved standing/walking tolerance by 50%.    Time  8    Period  Weeks    Status  On-going      PT LONG TERM GOAL #3   Title  Patient to report decreased pain by 25% with standing and walking.    Time  8    Period  Weeks    Status  On-going            Plan - 05/24/18 1420    Clinical Impression Statement  Patient tolerated treatment fair today. Patient  has ongoing SOB from bronchitis. Patient unable to complete standing exercises today and focused on manual STW to help reduce pain and tightness. Goals ongoing.    Rehab Potential  Fair    PT Frequency  2x / week    PT Duration  8 weeks    PT Treatment/Interventions  ADLs/Self Care Home Management;Cryotherapy;Electrical Stimulation;Moist Heat;Ultrasound;Neuromuscular re-education;Therapeutic exercise;Therapeutic activities;Patient/family education;Manual techniques;Dry needling    PT Next Visit Plan  cont with POC manual to bil gluteals/low back sit <> supine transfer training (STG); lumbar stabilization/ progress seated to standing core progression slowly    Consulted and Agree with Plan of Care  Patient       Patient will benefit from skilled therapeutic intervention in order to improve the following deficits and impairments:  Pain, Postural dysfunction, Decreased activity tolerance, Decreased strength, Impaired flexibility, Decreased mobility  Visit Diagnosis: Acute bilateral low back pain, with sciatica presence unspecified     Problem List Patient Active Problem List   Diagnosis Date Noted  . Chronic cholecystitis 01/16/2014  . Chronic cholecystitis with calculus 01/10/2014    DUNFORD, CHRISTINA P, PTA 05/24/2018, 2:30 PM  [facility] [place], [place], [address] Phone: [phone]   Fax:  [phone]  Name: CHESNI VOS MRN: [id number] Date of Birth: 1941/08/21  PHYSICAL THERAPY DISCHARGE SUMMARY  Visits from Start of Care: 7.  Current functional level related to goals / functional outcomes: See above.   Remaining deficits: See below.   Education / Equipment: HEP. Plan: Patient agrees to discharge.  Patient goals were partially met. Patient is being discharged due to not returning since the last visit.  ?????         Mali Applegate MPT

## 2018-08-31 ENCOUNTER — Other Ambulatory Visit: Payer: Self-pay | Admitting: Family Medicine

## 2018-08-31 ENCOUNTER — Ambulatory Visit
Admission: RE | Admit: 2018-08-31 | Discharge: 2018-08-31 | Disposition: A | Discharge status: Home / Self Care | Payer: Medicare Other | Source: Ambulatory Visit | Attending: Family Medicine | Admitting: Family Medicine

## 2018-08-31 ENCOUNTER — Other Ambulatory Visit (HOSPITAL_COMMUNITY): Payer: Self-pay | Admitting: Family Medicine

## 2018-08-31 DIAGNOSIS — R05 Cough: Secondary | ICD-10-CM

## 2018-08-31 DIAGNOSIS — R7989 Other specified abnormal findings of blood chemistry: Secondary | ICD-10-CM

## 2018-08-31 DIAGNOSIS — R0602 Shortness of breath: Secondary | ICD-10-CM

## 2018-08-31 DIAGNOSIS — I8290 Acute embolism and thrombosis of unspecified vein: Secondary | ICD-10-CM

## 2018-08-31 DIAGNOSIS — R059 Cough, unspecified: Secondary | ICD-10-CM

## 2018-08-31 MED ORDER — IOPAMIDOL (ISOVUE-370) INJECTION 76%
70.0000 mL | Freq: Once | INTRAVENOUS | Status: AC | PRN
  Administered 2018-08-31: 70 mL via INTRAVENOUS

## 2018-09-10 ENCOUNTER — Encounter: Payer: Self-pay | Admitting: Internal Medicine

## 2018-09-10 ENCOUNTER — Ambulatory Visit: Payer: Medicare Other | Admitting: Internal Medicine

## 2018-09-10 VITALS — BP 142/56 | HR 72 | Ht 67.0 in | Wt 270.0 lb

## 2018-09-10 DIAGNOSIS — I2584 Coronary atherosclerosis due to calcified coronary lesion: Secondary | ICD-10-CM

## 2018-09-10 DIAGNOSIS — Z79899 Other long term (current) drug therapy: Secondary | ICD-10-CM | POA: Diagnosis not present

## 2018-09-10 DIAGNOSIS — R0602 Shortness of breath: Secondary | ICD-10-CM | POA: Diagnosis not present

## 2018-09-10 DIAGNOSIS — I2510 Atherosclerotic heart disease of native coronary artery without angina pectoris: Secondary | ICD-10-CM | POA: Diagnosis not present

## 2018-09-10 DIAGNOSIS — R600 Localized edema: Secondary | ICD-10-CM

## 2018-09-10 NOTE — Patient Instructions (Signed)
Medication Instructions:  Continue current medications  If you need a refill on your cardiac medications before your next appointment, please call your pharmacy.   Lab work: Non-Fasting blood work TODAY - BNP, BMET  If you have labs (blood work) drawn today and your tests are completely normal, you will receive your results only by: Marland Kitchen MyChart Message (if you have MyChart) OR . A paper copy in the mail If you have any lab test that is abnormal or we need to change your treatment, we will call you to review the results.  Testing/Procedures: Your physician has requested that you have an echocardiogram. Echocardiography is a painless test that uses sound waves to create images of your heart. It provides your doctor with information about the size and shape of your heart and how well your heart's chambers and valves are working. This procedure takes approximately one hour. There are no restrictions for this procedure. -- done at [address] - [address] Floor  Follow-Up: At [facility], you and your health needs are our priority.  As part of our continuing mission to provide you with exceptional heart care, we have created designated Provider Care Teams.  These Care Teams include your primary Cardiologist (physician) and Advanced Practice Providers (APPs -  Physician Assistants and Nurse Practitioners) who all work together to provide you with the care you need, when you need it. You will need a follow up appointment with Dr. Rennis Golden after your testing is completed OR with one of the following Advanced Practice Providers on your designated Care Team: Azalee Course, [place] . Micah Flesher, PA-C  Any Other Special Instructions Will Be Listed Below (If Applicable).

## 2018-09-10 NOTE — Progress Notes (Signed)
OFFICE CONSULT NOTE  Chief Complaint:  Shortness of breath  Primary Care Physician: Claire Caraway, MD  HPI:  Claire Blake is a 77 y.o. female who is being seen today for the evaluation of shortness of breath at the request of Claire Caraway, MD.  This is a pleasant 77 year old female who is originally from the [place] area.  She presents with several weeks of progressive shortness of breath.  She has a history of episodes of bronchitis but no known COPD.  Her mother had COPD and died from this in her late 62s however she reports not being a smoker.  Recently she underwent work-up by her PCP including a d-dimer which was abnormal.  This led to lower extremity arterial Dopplers which were negative for DVT and a CT pulmonary angiogram which demonstrated no PE, however she was noted to have age advanced coronary atherosclerosis and borderline cardiomegaly without pericardial effusion or pleural effusions.  An EKG was performed in our office which indicated possible inferior infarct pattern which I personally reviewed and therefore she was referred for cardiovascular evaluation.  Claire Blake denies any chest pain, but reports exertional dyspnea.  She says she sleeps upright in a recliner but has for years.  She denies orthopnea, PND or other heart failure symptoms but has had lower extremity edema and recent weight gain.  PMHx:  Past Medical History:  Diagnosis Date  . Anemia    hx  . Bronchitis    hx  . CKD (chronic kidney disease), stage III ([place])   . Diabetes mellitus without complication ([place])   . Glaucoma   . Hyperlipidemia   . Hypertension   . Hypothyroidism   . Obesity   . Pneumonia    hx  . Rheumatic fever    77 yrs old  . Thyroid disease     Past Surgical History:  Procedure Laterality Date  . ABDOMINAL HYSTERECTOMY     partial  . ANKLE FRACTURE SURGERY    . BACK SURGERY  2006   ruptured disc  . CHOLECYSTECTOMY N/A 01/16/2014   Procedure: LAPAROSCOPIC  CHOLECYSTECTOMY WITH INTRAOPERATIVE CHOLANGIOGRAM;  Surgeon: Imogene Burn. Georgette Dover, MD;  Location: [place];  Service: General;  Laterality: N/A;  . ESOPHAGOGASTRODUODENOSCOPY N/A 01/16/2014   Procedure: ESOPHAGOGASTRODUODENOSCOPY (EGD);  Surgeon: Cleotis Nipper, MD;  Location: [facility] ENDOSCOPY;  Service: Endoscopy;  Laterality: N/A;    FAMHx:  Family History  Problem Relation Age of Onset  . COPD Mother   . Cancer Father        throat  . Cancer Maternal Grandmother        bilateral breast  . Breast cancer Maternal Grandmother 52    SOCHx:   reports that she has never smoked. She has never used smokeless tobacco. She reports that she does not drink alcohol or use drugs.  ALLERGIES:  Allergies  Allergen Reactions  . Zocor [Simvastatin]     Leg cramps    ROS: Pertinent items noted in HPI and remainder of comprehensive ROS otherwise negative.  HOME MEDS: Current Outpatient Medications on File Prior to Visit  Medication Sig Dispense Refill  . amLODipine (NORVASC) 2.5 MG tablet Take 2.5 mg by mouth daily.    . Blood Glucose Monitoring Suppl (ONE TOUCH ULTRA SYSTEM KIT) W/DEVICE KIT 1 kit by Does not apply route once.    . calcium carbonate (OSCAL) 1500 (600 Ca) MG TABS tablet Take 600 mg of elemental calcium by mouth 2 (two) times daily with a meal.    .  Cholecalciferol (VITAMIN D3) 3000 UNITS TABS Take 3,000 Units by mouth daily.     . hydrochlorothiazide (MICROZIDE) 12.5 MG capsule Take 12.5 mg by mouth daily.    Marland Kitchen levothyroxine (SYNTHROID, LEVOTHROID) 88 MCG tablet Take 88 mcg by mouth daily before breakfast.    . lisinopril (PRINIVIL,ZESTRIL) 40 MG tablet Take 40 mg by mouth daily.    Marland Kitchen loratadine (CLARITIN) 10 MG tablet Take 10 mg by mouth daily.    . metFORMIN (GLUCOPHAGE) 1000 MG tablet Take 1,000 mg by mouth 2 (two) times daily with a meal.    . Multiple Vitamin (MULTIVITAMIN WITH MINERALS) TABS tablet Take 1 tablet by mouth daily.    . pioglitazone (ACTOS) 45 MG tablet Take 45 mg  by mouth daily.     No current facility-administered medications on file prior to visit.     LABS/IMAGING: No results found for this or any previous visit (from the past 48 hour(s)). No results found.  LIPID PANEL: No results found for: CHOL, TRIG, HDL, CHOLHDL, VLDL, LDLCALC, LDLDIRECT  WEIGHTS: Wt Readings from Last 3 Encounters:  09/10/18 270 lb (122.5 kg)  01/16/14 254 lb 3.1 oz (115.3 kg)  01/14/14 242 lb 2 oz (109.8 kg)    VITALS: BP (!) 142/56   Pulse 72   Ht _0  (1.702 m)   Wt 270 lb (122.5 kg)   BMI 42.29 kg/m   EXAM: General appearance: alert, no distress and morbidly obese Neck: no carotid bruit, no JVD and thyroid not enlarged, symmetric, no tenderness/mass/nodules Lungs: clear to auscultation bilaterally Heart: regular rate and rhythm Abdomen: soft, non-tender; bowel sounds normal; no masses,  no organomegaly and obese Extremities: edema Trace to 1+ bilateral lower extremity edema Pulses: 2+ and symmetric Skin: Skin color, texture, turgor normal. No rashes or lesions Neurologic: Grossly normal Psych: Pleasant  EKG: Sinus rhythm with PACs at 72, poor R wave progression- personally reviewed  ASSESSMENT: 1. Progressive dyspnea on exertion 2. Morbid obesity 3. Multivessel coronary artery calcification 4. Type 2 diabetes 5. Hypertension 6. Hyperlipidemia 7. CKD 3  PLAN: 1.   Claire Blake has multiple cardiovascular risk factors and is found to have multivessel coronary artery calcification and progressive dyspnea on exertion.  She has had lower extremity swelling and some weight gain and symptoms concerning for possible heart failure.  I like to start with an echocardiogram and will obtain a metabolic profile and BNP.  She will most likely need stress testing as well however this will be dependent on the findings of her echocardiogram.  If LVEF is significantly decreased, she may need cardiac catheterization.  We will also likely need to start her on Lasix  and discontinue her thiazide diuretic based on her BNP and additional work-up.  If for some reason cardiac testing is unrevealing, then I would also consider diagnosis of COPD/chronic bronchitis and consider full pulmonary function testing and treatment as necessary.  Weight loss was strongly encouraged.  Thanks again for the kind referral.  Follow-up with me after testing.  Pixie Casino, MD, [facility], [place] Director of the Advanced Lipid Disorders &  Cardiovascular Risk Reduction Clinic Diplomate of the American Board of Clinical Lipidology Attending Cardiologist  Direct Dial: [phone]  Fax: [phone]  Website:  [email or web address] 09/10/2018, 3:44 PM

## 2018-09-11 LAB — BASIC METABOLIC PANEL
BUN/Creatinine Ratio: 23 (ref 12–28)
BUN: 30 mg/dL — AB (ref 8–27)
CHLORIDE: 102 mmol/L (ref 96–106)
CO2: 24 mmol/L (ref 20–29)
CREATININE: 1.3 mg/dL — AB (ref 0.57–1.00)
Calcium: 10.1 mg/dL (ref 8.7–10.3)
GFR, EST AFRICAN AMERICAN: 46 mL/min/{1.73_m2} — AB (ref >59)
GFR, EST NON AFRICAN AMERICAN: 40 mL/min/{1.73_m2} — AB (ref >59)
Glucose: 125 mg/dL — ABNORMAL HIGH (ref 65–99)
Potassium: 4.6 mmol/L (ref 3.5–5.2)
Sodium: 140 mmol/L (ref 134–144)

## 2018-09-11 LAB — PRO B NATRIURETIC PEPTIDE: NT-Pro BNP: 112 pg/mL (ref 0–738)

## 2018-09-12 ENCOUNTER — Telehealth: Payer: Self-pay | Admitting: Internal Medicine

## 2018-09-12 NOTE — Telephone Encounter (Signed)
Follow Up:     Returning Chelly's call from yesterday, concerning her lab results.

## 2018-09-12 NOTE — Telephone Encounter (Signed)
Notes recorded by Chrystie Nose, MD on 09/11/2018 at 8:43 AM EDT Creatinine stable and BNP low - hold on starting diuretic. Await echo.  Dr. Rexene Edison   Patient aware and verbalized understanding

## 2018-09-20 ENCOUNTER — Ambulatory Visit (HOSPITAL_COMMUNITY): Payer: Medicare Other | Attending: Cardiovascular Disease

## 2018-09-20 ENCOUNTER — Other Ambulatory Visit: Payer: Self-pay

## 2018-09-20 DIAGNOSIS — R0602 Shortness of breath: Secondary | ICD-10-CM | POA: Diagnosis present

## 2018-09-20 DIAGNOSIS — I2584 Coronary atherosclerosis due to calcified coronary lesion: Secondary | ICD-10-CM

## 2018-09-20 DIAGNOSIS — R600 Localized edema: Secondary | ICD-10-CM | POA: Diagnosis not present

## 2018-09-20 DIAGNOSIS — I2510 Atherosclerotic heart disease of native coronary artery without angina pectoris: Secondary | ICD-10-CM | POA: Diagnosis not present

## 2018-09-26 ENCOUNTER — Encounter: Payer: Self-pay | Admitting: Internal Medicine

## 2018-09-26 ENCOUNTER — Ambulatory Visit: Payer: Medicare Other | Admitting: Internal Medicine

## 2018-09-26 VITALS — BP 120/70 | HR 71 | Ht 67.0 in | Wt 273.0 lb

## 2018-09-26 DIAGNOSIS — E782 Mixed hyperlipidemia: Secondary | ICD-10-CM | POA: Insufficient documentation

## 2018-09-26 DIAGNOSIS — R0602 Shortness of breath: Secondary | ICD-10-CM

## 2018-09-26 DIAGNOSIS — N183 Chronic kidney disease, stage 3 unspecified: Secondary | ICD-10-CM | POA: Insufficient documentation

## 2018-09-26 DIAGNOSIS — I2584 Coronary atherosclerosis due to calcified coronary lesion: Secondary | ICD-10-CM

## 2018-09-26 DIAGNOSIS — I2510 Atherosclerotic heart disease of native coronary artery without angina pectoris: Secondary | ICD-10-CM

## 2018-09-26 DIAGNOSIS — E668 Other obesity: Secondary | ICD-10-CM | POA: Insufficient documentation

## 2018-09-26 DIAGNOSIS — R600 Localized edema: Secondary | ICD-10-CM

## 2018-09-26 DIAGNOSIS — M949 Disorder of cartilage, unspecified: Secondary | ICD-10-CM

## 2018-09-26 DIAGNOSIS — D649 Anemia, unspecified: Secondary | ICD-10-CM | POA: Insufficient documentation

## 2018-09-26 DIAGNOSIS — E1121 Type 2 diabetes mellitus with diabetic nephropathy: Secondary | ICD-10-CM | POA: Insufficient documentation

## 2018-09-26 DIAGNOSIS — E039 Hypothyroidism, unspecified: Secondary | ICD-10-CM | POA: Insufficient documentation

## 2018-09-26 DIAGNOSIS — M545 Low back pain, unspecified: Secondary | ICD-10-CM | POA: Insufficient documentation

## 2018-09-26 DIAGNOSIS — H40009 Preglaucoma, unspecified, unspecified eye: Secondary | ICD-10-CM | POA: Insufficient documentation

## 2018-09-26 DIAGNOSIS — M899 Disorder of bone, unspecified: Secondary | ICD-10-CM | POA: Insufficient documentation

## 2018-09-26 DIAGNOSIS — E559 Vitamin D deficiency, unspecified: Secondary | ICD-10-CM | POA: Insufficient documentation

## 2018-09-26 DIAGNOSIS — I10 Essential (primary) hypertension: Secondary | ICD-10-CM | POA: Insufficient documentation

## 2018-09-26 NOTE — Patient Instructions (Signed)
Medication Instructions:  Continue current medications If you need a refill on your cardiac medications before your next appointment, please call your pharmacy.   Lab work: NONE  Testing/Procedures: Your physician has recommended that you have a pulmonary function test. Pulmonary Function Tests are a group of tests that measure how well air moves in and out of your lungs. -- this is done at [facility]  Follow-Up: At [facility], you and your health needs are our priority.  As part of our continuing mission to provide you with exceptional heart care, we have created designated Provider Care Teams.  These Care Teams include your primary Cardiologist (physician) and Advanced Practice Providers (APPs -  Physician Assistants and Nurse Practitioners) who all work together to provide you with the care you need, when you need it. You will need a follow up appointment in 4-6 weeks.  You may see Chrystie Nose, MD or one of the following Advanced Practice Providers on your designated Care Team: [place], [place] . Micah Flesher, PA-C  Any Other Special Instructions Will Be Listed Below (If Applicable).

## 2018-09-26 NOTE — Progress Notes (Signed)
OFFICE CONSULT NOTE  Chief Complaint:  Shortness of breath  Primary Care Physician: Cari Caraway, MD  HPI:  Claire Blake is a 77 y.o. female who is being seen today for the evaluation of shortness of breath at the request of Cari Caraway, MD.  This is a pleasant 77 year old female who is originally from the [place] area.  She presents with several weeks of progressive shortness of breath.  She has a history of episodes of bronchitis but no known COPD.  Her mother had COPD and died from this in her late 60s however she reports not being a smoker.  Recently she underwent work-up by her PCP including a d-dimer which was abnormal.  This led to lower extremity arterial Dopplers which were negative for DVT and a CT pulmonary angiogram which demonstrated no PE, however she was noted to have age advanced coronary atherosclerosis and borderline cardiomegaly without pericardial effusion or pleural effusions.  An EKG was performed in our office which indicated possible inferior infarct pattern which I personally reviewed and therefore she was referred for cardiovascular evaluation.  Ms. Blake denies any chest pain, but reports exertional dyspnea.  She says she sleeps upright in a recliner but has for years.  She denies orthopnea, PND or other heart failure symptoms but has had lower extremity edema and recent weight gain.  09/26/2018  Claire Blake returns today for follow-up of her shortness of breath.  She underwent an echocardiogram which showed normal systolic function mild diastolic dysfunction.  BNP was low and metabolic profile was fairly normal.  Weight is up a few more pounds since she was last seen however blood pressure is well controlled today.  She reports some improvement in her dizziness and no worsening of her shortness of breath.  She denies any chest pain.  Symptoms could be coronary ischemia however she has had no typical symptoms of that.  PMHx:  Past Medical History:    Diagnosis Date  . Anemia    hx  . Bronchitis    hx  . CKD (chronic kidney disease), stage III ([place])   . Diabetes mellitus without complication ([place])   . Glaucoma   . Hyperlipidemia   . Hypertension   . Hypothyroidism   . Obesity   . Pneumonia    hx  . Rheumatic fever    77 yrs old  . Thyroid disease     Past Surgical History:  Procedure Laterality Date  . ABDOMINAL HYSTERECTOMY     partial  . ANKLE FRACTURE SURGERY    . BACK SURGERY  2006   ruptured disc  . CHOLECYSTECTOMY N/A 01/16/2014   Procedure: LAPAROSCOPIC CHOLECYSTECTOMY WITH INTRAOPERATIVE CHOLANGIOGRAM;  Surgeon: Imogene Burn. Georgette Dover, MD;  Location: [place];  Service: General;  Laterality: N/A;  . ESOPHAGOGASTRODUODENOSCOPY N/A 01/16/2014   Procedure: ESOPHAGOGASTRODUODENOSCOPY (EGD);  Surgeon: Cleotis Nipper, MD;  Location: [facility] ENDOSCOPY;  Service: Endoscopy;  Laterality: N/A;    FAMHx:  Family History  Problem Relation Age of Onset  . COPD Mother   . Cancer Father        throat  . Cancer Maternal Grandmother        bilateral breast  . Breast cancer Maternal Grandmother 30    SOCHx:   reports that she has never smoked. She has never used smokeless tobacco. She reports that she does not drink alcohol or use drugs.  ALLERGIES:  Allergies  Allergen Reactions  . Zocor [Simvastatin]     Leg cramps  ROS: Pertinent items noted in HPI and remainder of comprehensive ROS otherwise negative.  HOME MEDS: Current Outpatient Medications on File Prior to Visit  Medication Sig Dispense Refill  . amLODipine (NORVASC) 2.5 MG tablet Take 2.5 mg by mouth daily.    Marland Kitchen aspirin EC 81 MG tablet Take 81 mg by mouth daily.    . Blood Glucose Monitoring Suppl (ONE TOUCH ULTRA SYSTEM KIT) W/DEVICE KIT 1 kit by Does not apply route once.    . calcium carbonate (OSCAL) 1500 (600 Ca) MG TABS tablet Take 600 mg of elemental calcium by mouth 2 (two) times daily with a meal.    . Cholecalciferol (VITAMIN D) 2000 units CAPS Take  2,000 Units by mouth daily.    Marland Kitchen glimepiride (AMARYL) 4 MG tablet Take 2 mg by mouth 2 (two) times daily.    . hydrochlorothiazide (HYDRODIURIL) 25 MG tablet Take 25 mg by mouth daily.  1  . levothyroxine (SYNTHROID, LEVOTHROID) 88 MCG tablet Take 88 mcg by mouth daily before breakfast.    . lisinopril (PRINIVIL,ZESTRIL) 40 MG tablet Take 40 mg by mouth daily.    Marland Kitchen loratadine (CLARITIN) 10 MG tablet Take 10 mg by mouth daily.    . metFORMIN (GLUCOPHAGE-XR) 500 MG 24 hr tablet Take 500 mg by mouth 2 (two) times daily.  1  . Multiple Vitamin (MULTIVITAMIN WITH MINERALS) TABS tablet Take 1 tablet by mouth daily.    . Omega-3 Fatty Acids (OMEGA 3 500 PO) Take 1 capsule by mouth daily.    . pioglitazone (ACTOS) 45 MG tablet Take 45 mg by mouth daily.    . rosuvastatin (CRESTOR) 20 MG tablet Take 20 mg by mouth daily.     No current facility-administered medications on file prior to visit.     LABS/IMAGING: No results found for this or any previous visit (from the past 48 hour(s)). No results found.  LIPID PANEL: No results found for: CHOL, TRIG, HDL, CHOLHDL, VLDL, LDLCALC, LDLDIRECT  WEIGHTS: Wt Readings from Last 3 Encounters:  09/26/18 273 lb (123.8 kg)  09/10/18 270 lb (122.5 kg)  01/16/14 254 lb 3.1 oz (115.3 kg)    VITALS: BP 120/70   Pulse 71   Ht 5' 7" (1.702 m)   Wt 273 lb (123.8 kg)   SpO2 95%   BMI 42.76 kg/m   EXAM: Deferred  EKG: Deferred  ASSESSMENT: 1. Progressive dyspnea on exertion 2. Morbid obesity 3. Multivessel coronary artery calcification 4. Type 2 diabetes 5. Hypertension 6. Hyperlipidemia 7. CKD 3  PLAN: 1.   Claire Blake did not have echocardiographic findings suggestive of her shortness of breath.  BNP was low and there is only mild diastolic dysfunction.  Weight may be a big factor in this but her weight is been excessive for some time.  She may also have underlying pulmonary disease.  She reports frequent pulmonary infections but is never  had any pulmonary function testing.  I recommend PFTs and if they are abnormal will refer to pulmonary.  Follow-up in 6 months.  Pixie Casino, MD, [facility], [place] Director of the Advanced Lipid Disorders &  Cardiovascular Risk Reduction Clinic Diplomate of the American Board of Clinical Lipidology Attending Cardiologist  Direct Dial: [phone]  Fax: [phone]  Website:  [email or web address] 09/26/2018, 2:03 PM

## 2018-10-04 ENCOUNTER — Encounter (HOSPITAL_COMMUNITY): Payer: Medicare Other

## 2018-10-10 ENCOUNTER — Ambulatory Visit (HOSPITAL_COMMUNITY)
Admission: RE | Admit: 2018-10-10 | Discharge: 2018-10-10 | Disposition: A | Discharge status: Home / Self Care | Payer: Medicare Other | Source: Ambulatory Visit | Attending: Internal Medicine | Admitting: Internal Medicine

## 2018-10-10 DIAGNOSIS — R0602 Shortness of breath: Secondary | ICD-10-CM | POA: Diagnosis not present

## 2018-10-10 LAB — PULMONARY FUNCTION TEST
DL/VA % PRED: 88 %
DL/VA: 4.53 ml/min/mmHg/L
DLCO unc % pred: 47 %
DLCO unc: 13.34 ml/min/mmHg
FEF 25-75 POST: 1.99 L/s
FEF 25-75 Pre: 1.34 L/sec
FEF2575-%Change-Post: 48 %
FEF2575-%PRED-POST: 114 %
FEF2575-%Pred-Pre: 76 %
FEV1-%Change-Post: 8 %
FEV1-%PRED-PRE: 57 %
FEV1-%Pred-Post: 61 %
FEV1-PRE: 1.33 L
FEV1-Post: 1.45 L
FEV1FVC-%CHANGE-POST: 6 %
FEV1FVC-%Pred-Pre: 108 %
FEV6-%Change-Post: 1 %
FEV6-%PRED-PRE: 55 %
FEV6-%Pred-Post: 56 %
FEV6-PRE: 1.64 L
FEV6-Post: 1.67 L
FEV6FVC-%Pred-Post: 105 %
FEV6FVC-%Pred-Pre: 105 %
FVC-%CHANGE-POST: 1 %
FVC-%PRED-POST: 53 %
FVC-%PRED-PRE: 52 %
FVC-POST: 1.67 L
FVC-PRE: 1.64 L
POST FEV6/FVC RATIO: 100 %
PRE FEV1/FVC RATIO: 81 %
Post FEV1/FVC ratio: 86 %
Pre FEV6/FVC Ratio: 100 %
RV % pred: 95 %
RV: 2.38 L
TLC % PRED: 74 %
TLC: 4.07 L

## 2018-10-10 MED ORDER — ALBUTEROL SULFATE (2.5 MG/3ML) 0.083% IN NEBU
2.5000 mg | INHALATION_SOLUTION | Freq: Once | RESPIRATORY_TRACT | Status: AC
  Administered 2018-10-10: 2.5 mg via RESPIRATORY_TRACT

## 2018-10-12 ENCOUNTER — Telehealth: Payer: Self-pay | Admitting: Internal Medicine

## 2018-10-12 DIAGNOSIS — R942 Abnormal results of pulmonary function studies: Secondary | ICD-10-CM

## 2018-10-12 NOTE — Telephone Encounter (Signed)
Patient calling for text results.  Can be reached at [phone] until 1pm, after that can be reached at [phone]

## 2018-10-12 NOTE — Telephone Encounter (Signed)
Pt informed of providers result & recommendations. Pt verbalized understanding. No further questions . Pt states that she will make sure to come to appt scheduled with Dr Rennis Golden  Referral placed

## 2018-10-12 NOTE — Telephone Encounter (Signed)
Notes recorded by Chrystie Nose, MD on 10/10/2018 at 1:28 PM EST Will probably need pulmonary referral for Abnormal PFT's - also will need ischemia evaluation given multivessel CAD - recommend lexiscan myoview. Will d/w patient at follow-up.

## 2018-10-15 ENCOUNTER — Telehealth: Payer: Self-pay | Admitting: Internal Medicine

## 2018-10-15 DIAGNOSIS — R0602 Shortness of breath: Secondary | ICD-10-CM

## 2018-10-15 DIAGNOSIS — I2584 Coronary atherosclerosis due to calcified coronary lesion: Principal | ICD-10-CM

## 2018-10-15 DIAGNOSIS — I2510 Atherosclerotic heart disease of native coronary artery without angina pectoris: Secondary | ICD-10-CM

## 2018-10-15 NOTE — Telephone Encounter (Signed)
  Our office made an appt for Claire Blake with  Pulmonary and she would like to know why we made an appt with a DO instead of an MD. Can you please call to discuss.

## 2018-10-15 NOTE — Telephone Encounter (Signed)
Returned call to patient she wanted to ask Dr.Hilty if ok to see Dr.Icard who is a DO and not a MD.Appointment scheduled with Dr.Icard 10/30/18.Message sent to Dr.Hilty for advice.

## 2018-10-16 ENCOUNTER — Encounter: Payer: Self-pay | Admitting: Internal Medicine

## 2018-10-16 NOTE — Telephone Encounter (Signed)
Spoke with patient and relayed MD info concerning Dr. Tonia BroomsIcard. She is agreeable to seeing this provider.   Also, patient was not notified of need for stress test. Explained why MD has suggested this and she is agreeable. Test ordered but she wishes to wait a few week to schedule. Message sent to [facility] scheduling pool to arrange.

## 2018-10-16 NOTE — Telephone Encounter (Signed)
I know Dr. Tonia BroomsIcard personally - I think he is a well-trained physician - no concerns.  Dr. HRexene Edison

## 2018-10-24 ENCOUNTER — Ambulatory Visit: Payer: Medicare Other | Admitting: Internal Medicine

## 2018-10-30 ENCOUNTER — Encounter: Payer: Self-pay | Admitting: Pulmonary Disease

## 2018-10-30 ENCOUNTER — Ambulatory Visit: Payer: Medicare Other | Admitting: Pulmonary Disease

## 2018-10-30 ENCOUNTER — Telehealth (HOSPITAL_COMMUNITY): Payer: Self-pay

## 2018-10-30 ENCOUNTER — Institutional Professional Consult (permissible substitution): Payer: Medicare Other | Admitting: Pulmonary Disease

## 2018-10-30 VITALS — BP 140/64 | HR 83 | Ht 67.0 in | Wt 270.2 lb

## 2018-10-30 DIAGNOSIS — R600 Localized edema: Secondary | ICD-10-CM

## 2018-10-30 DIAGNOSIS — R0609 Other forms of dyspnea: Secondary | ICD-10-CM | POA: Diagnosis not present

## 2018-10-30 DIAGNOSIS — I288 Other diseases of pulmonary vessels: Secondary | ICD-10-CM | POA: Diagnosis not present

## 2018-10-30 DIAGNOSIS — R0683 Snoring: Secondary | ICD-10-CM

## 2018-10-30 DIAGNOSIS — R911 Solitary pulmonary nodule: Secondary | ICD-10-CM | POA: Diagnosis not present

## 2018-10-30 DIAGNOSIS — R942 Abnormal results of pulmonary function studies: Secondary | ICD-10-CM

## 2018-10-30 NOTE — Telephone Encounter (Signed)
Encounter complete. 

## 2018-10-30 NOTE — Patient Instructions (Addendum)
Thank you for visiting Dr. Tonia BroomsIcard at [facility]. Today we recommend the following: Orders Placed This Encounter  Procedures  . CT Chest Wo Contrast   Please set patient up with office sleep physician consultation for obstructive sleep apnea with Dr. Wynona Neatlalere.   Return in about 3 months (around 01/30/2019).

## 2018-10-30 NOTE — Progress Notes (Signed)
Synopsis: Referred in November 2019 for abnormal PFTs by Cari Caraway, MD  Subjective:   PATIENT ID: Claire Blake GENDER: female DOB: March 11, 1941, MRN: [id number]  Chief Complaint  Patient presents with  . Consult    Patient reports she woke up one morning and she was very dizzy and very SOB. This started 2 months ago. SOB has slowly gotten worse.     Several weeks ago she got up in the middle of the night with SOB walking far distances. She was seen by her PCP which ordered and ECG, CXR, evaluated her for blood clots, CT chest was completed. Mother with a history of COPD and passed. Life long non-smoker. She did have significant second hand smoke exposure. Mother was a long time smoker.  During this time she also met Dr. Debara Pickett for cardiology evaluation.  They ordered a echocardiogram which revealed a normal ejection fraction and the right ventricle was poorly visualized with no estimation of her PA pressures.  CTA prior to this did reveal enlarged pulmonary arteries.  She does have bilateral lower extremity edema the left side is worse than the right.  She states that she did have a surgery in the left ankle many years ago and has always had a little bit of edema.  But she has noticed over the past couple weeks she has had some increased edema in the lower extremities.  Patient had pulmonary function tests that were completed by her cardiologist which revealed impaired spirometry with a preserved ratio and a reduced DLCO at 47%.  Her ERV was 10% of predicted.  She was referred to pulmonary for evaluation of the abnormal PFTs by cardiology.    Past Medical History:  Diagnosis Date  . Anemia    hx  . Bronchitis    hx  . CKD (chronic kidney disease), stage III ([place])   . Diabetes mellitus without complication ([place])   . Glaucoma   . Hyperlipidemia   . Hypertension   . Hypothyroidism   . Obesity   . Pneumonia    hx  . Rheumatic fever    77 yrs old  . Thyroid disease      Family  History  Problem Relation Age of Onset  . COPD Mother   . Cancer Father        throat  . Cancer Maternal Grandmother        bilateral breast  . Breast cancer Maternal Grandmother 59     Past Surgical History:  Procedure Laterality Date  . ABDOMINAL HYSTERECTOMY     partial  . ANKLE FRACTURE SURGERY    . BACK SURGERY  2006   ruptured disc  . CHOLECYSTECTOMY N/A 01/16/2014   Procedure: LAPAROSCOPIC CHOLECYSTECTOMY WITH INTRAOPERATIVE CHOLANGIOGRAM;  Surgeon: Imogene Burn. Georgette Dover, MD;  Location: [facility];  Service: General;  Laterality: N/A;  . ESOPHAGOGASTRODUODENOSCOPY N/A 01/16/2014   Procedure: ESOPHAGOGASTRODUODENOSCOPY (EGD);  Surgeon: Cleotis Nipper, MD;  Location: [facility] ENDOSCOPY;  Service: Endoscopy;  Laterality: N/A;    Social History   Socioeconomic History  . Marital status: Divorced    Spouse name: Not on file  . Number of children: Not on file  . Years of education: Not on file  . Highest education level: Not on file  Occupational History  . Not on file  Social Needs  . Financial resource strain: Not on file  . Food insecurity:    Worry: Not on file    Inability: Not on file  . Transportation needs:  Medical: Not on file    Non-medical: Not on file  Tobacco Use  . Smoking status: Never Smoker  . Smokeless tobacco: Never Used  Substance and Sexual Activity  . Alcohol use: No  . Drug use: No  . Sexual activity: Not on file  Lifestyle  . Physical activity:    Days per week: Not on file    Minutes per session: Not on file  . Stress: Not on file  Relationships  . Social connections:    Talks on phone: Not on file    Gets together: Not on file    Attends religious service: Not on file    Active member of club or organization: Not on file    Attends meetings of clubs or organizations: Not on file    Relationship status: Not on file  . Intimate partner violence:    Fear of current or ex partner: Not on file    Emotionally abused: Not on file    Physically  abused: Not on file    Forced sexual activity: Not on file  Other Topics Concern  . Not on file  Social History Narrative  . Not on file     Allergies  Allergen Reactions  . Zocor [Simvastatin]     Leg cramps     Outpatient Medications Prior to Visit  Medication Sig Dispense Refill  . amLODipine (NORVASC) 2.5 MG tablet Take 2.5 mg by mouth daily.    Marland Kitchen aspirin EC 81 MG tablet Take 81 mg by mouth daily.    . Blood Glucose Monitoring Suppl (ONE TOUCH ULTRA SYSTEM KIT) W/DEVICE KIT 1 kit by Does not apply route once.    . calcium carbonate (OSCAL) 1500 (600 Ca) MG TABS tablet Take 600 mg of elemental calcium by mouth 2 (two) times daily with a meal.    . Cholecalciferol (VITAMIN D) 2000 units CAPS Take 2,000 Units by mouth daily.    Marland Kitchen glimepiride (AMARYL) 4 MG tablet Take 2 mg by mouth 2 (two) times daily.    . hydrochlorothiazide (HYDRODIURIL) 25 MG tablet Take 25 mg by mouth daily.  1  . levothyroxine (SYNTHROID, LEVOTHROID) 88 MCG tablet Take 88 mcg by mouth daily before breakfast.    . lisinopril (PRINIVIL,ZESTRIL) 40 MG tablet Take 40 mg by mouth daily.    Marland Kitchen loratadine (CLARITIN) 10 MG tablet Take 10 mg by mouth daily.    . metFORMIN (GLUCOPHAGE-XR) 500 MG 24 hr tablet Take 500 mg by mouth 2 (two) times daily.  1  . Multiple Vitamin (MULTIVITAMIN WITH MINERALS) TABS tablet Take 1 tablet by mouth daily.    . Omega-3 Fatty Acids (OMEGA 3 500 PO) Take 1 capsule by mouth daily.    . pioglitazone (ACTOS) 45 MG tablet Take 45 mg by mouth daily.    . rosuvastatin (CRESTOR) 20 MG tablet Take 20 mg by mouth daily.     No facility-administered medications prior to visit.     Review of Systems  Constitutional: Negative for chills, fever, malaise/fatigue and weight loss.  HENT: Negative for hearing loss, sore throat and tinnitus.   Eyes: Negative for blurred vision and double vision.  Respiratory: Positive for shortness of breath. Negative for cough, hemoptysis, sputum production,  wheezing and stridor.   Cardiovascular: Negative for chest pain, palpitations, orthopnea, leg swelling and PND.  Gastrointestinal: Negative for abdominal pain, constipation, diarrhea, heartburn, nausea and vomiting.  Genitourinary: Negative for dysuria, hematuria and urgency.  Musculoskeletal: Negative for joint pain and myalgias.  Skin: Negative for itching and rash.  Neurological: Negative for dizziness, tingling, weakness and headaches.  Endo/Heme/Allergies: Negative for environmental allergies. Does not bruise/bleed easily.  Psychiatric/Behavioral: Negative for depression. The patient is not nervous/anxious and does not have insomnia.   All other systems reviewed and are negative.    Objective:  Physical Exam  Constitutional: She is oriented to person, place, and time. She appears well-developed and well-nourished. No distress.  Obese  HENT:  Head: Normocephalic and atraumatic.  Mouth/Throat: Oropharynx is clear and moist.  Visible jugular venous pulsation at the lower anterior neck  Mallampati 4  Eyes: Pupils are equal, round, and reactive to light. Conjunctivae are normal. No scleral icterus.  Neck: Neck supple. No tracheal deviation present.  Cardiovascular: Normal rate, regular rhythm, normal heart sounds and intact distal pulses.  No murmur heard. Distant heart tones, S2 may be louder than S1 however difficult to auscultate  Pulmonary/Chest: Effort normal and breath sounds normal. No accessory muscle usage or stridor. No tachypnea. No respiratory distress. She has no wheezes. She has no rhonchi. She has no rales.  Abdominal: Soft. Bowel sounds are normal. She exhibits no distension. There is no tenderness.  Obese pannus  Musculoskeletal: She exhibits edema. She exhibits no tenderness.  Significant bilateral pitting edema  Lymphadenopathy:    She has no cervical adenopathy.  Neurological: She is alert and oriented to person, place, and time.  Skin: Skin is warm and dry.  Capillary refill takes less than 2 seconds. No rash noted.  Ready lower extremities, tense edematous subcu, flaking dry.  Psychiatric: She has a normal mood and affect. Her behavior is normal.  Vitals reviewed.    Vitals:   10/30/18 1408  BP: 140/64  Pulse: 83  SpO2: 93%  Weight: 270 lb 3.2 oz (122.6 kg)  Height: '5\' 7"'  (1.702 m)   93% on RA BMI Readings from Last 3 Encounters:  10/30/18 42.32 kg/m  09/26/18 42.76 kg/m  09/10/18 42.29 kg/m   Wt Readings from Last 3 Encounters:  10/30/18 270 lb 3.2 oz (122.6 kg)  09/26/18 273 lb (123.8 kg)  09/10/18 270 lb (122.5 kg)     CBC    Component Value Date/Time   WBC 7.7 01/18/2014 0420   RBC 2.73 (L) 01/18/2014 0420   HGB 7.8 (L) 01/18/2014 0420   HCT 23.3 (L) 01/18/2014 0420   PLT 377 01/18/2014 0420   MCV 85.3 01/18/2014 0420   MCH 28.6 01/18/2014 0420   MCHC 33.5 01/18/2014 0420   RDW 15.9 (H) 01/18/2014 0420     Chest Imaging: 08/31/2018 CT chest: Enlarged heart, cardiomegaly, enlarged pulmonary arteries on imaging in comparison to the aorta. Coronary calcifications with atherosclerosis. Right upper lobe 3 mm noncalcified lung nodule.  Mildly nodular hepatic contour concerning for cirrhosis.  Pulmonary Functions Testing Results: PFT Results Latest Ref Rng & Units 10/10/2018  FVC-Pre L 1.64  FVC-Predicted Pre % 52  FVC-Post L 1.67  FVC-Predicted Post % 53  Pre FEV1/FVC % % 81  Post FEV1/FCV % % 86  FEV1-Pre L 1.33  FEV1-Predicted Pre % 57  FEV1-Post L 1.45  DLCO UNC% % 47  DLCO COR %Predicted % 88  TLC L 4.07  TLC % Predicted % 74  RV % Predicted % 95     FeNO: None   Pathology: None   Echocardiogram:   Study Conclusions  - Left ventricle: The cavity size was normal. Wall thickness was   normal. Systolic function was normal. The estimated ejection  fraction was in the range of 55% to 60%. Wall motion was normal;   there were no regional wall motion abnormalities. Doppler   parameters are  consistent with abnormal left ventricular   relaxation (grade 1 diastolic dysfunction). - Mitral valve: Mild focal calcification of the anterior leaflet.   Valve area by pressure half-time: 2.42 cm^2.  Heart Catheterization: None     Assessment & Plan:   Solitary pulmonary nodule - Plan: CT Chest Wo Contrast  DOE (dyspnea on exertion)  Enlarged pulmonary artery (HCC)  Bilateral lower extremity edema  Snoring  Diffusion capacity of lung (dl), decreased  Discussion:  This is a 77 year old female, morbidly obese, BMI 42.3.  PFTs with a preserved ratio and impaired spirometry with a significantly reduced ERV of 10% predicted.  This pattern is most consistent with obesity related restrictive lung disease.  Her TLC is only 74% predicted.  She does have a significantly reduced DLCO of 47% in combination would be concerning for an underlying interstitial lung disease however her recent CT imaging revealed no parenchymal lung disease.  With a normal lung parenchymal CTA of the chest I see no reason for repeat axial imaging.  As for her enlarged pulmonary arteries seen on CT and physical exam with significant bilateral lower extremity edema and progressive dyspnea on exertion with a reduced DLCO one must consider the diagnosis of pulmonary hypertension.  She also has the body habitus concerning for sleep apnea and she also has complaints of snoring at night and she sleeps in a chair.  We will schedule a 50-monthnoncontrast CT image follow-up for her 3 mm right upper lobe pulmonary nodule.  I suspect this is low risk due to her lifelong non-smoking history.  I will send a message to her cardiologist about my concern for pulmonary hypertension.  Pending her evaluation with stress imaging.  She may benefit from right heart catheterization.  I will also have her set up with our sleep clinic for evaluation of underlying sleep apnea, with Dr. OAnder Slade   Greater than 50% of this patient 60-minute  office visit was spent face-to-face discussing the above recommendations and treatment plan.  We also went over the diagnostic algorithm and steps as described above.  We also reviewed PFTs as well as CT imaging face-to-face during the visit.   Current Outpatient Medications:  .  amLODipine (NORVASC) 2.5 MG tablet, Take 2.5 mg by mouth daily., Disp: , Rfl:  .  aspirin EC 81 MG tablet, Take 81 mg by mouth daily., Disp: , Rfl:  .  Blood Glucose Monitoring Suppl (ONE TOUCH ULTRA SYSTEM KIT) W/DEVICE KIT, 1 kit by Does not apply route once., Disp: , Rfl:  .  calcium carbonate (OSCAL) 1500 (600 Ca) MG TABS tablet, Take 600 mg of elemental calcium by mouth 2 (two) times daily with a meal., Disp: , Rfl:  .  Cholecalciferol (VITAMIN D) 2000 units CAPS, Take 2,000 Units by mouth daily., Disp: , Rfl:  .  glimepiride (AMARYL) 4 MG tablet, Take 2 mg by mouth 2 (two) times daily., Disp: , Rfl:  .  hydrochlorothiazide (HYDRODIURIL) 25 MG tablet, Take 25 mg by mouth daily., Disp: , Rfl: 1 .  levothyroxine (SYNTHROID, LEVOTHROID) 88 MCG tablet, Take 88 mcg by mouth daily before breakfast., Disp: , Rfl:  .  lisinopril (PRINIVIL,ZESTRIL) 40 MG tablet, Take 40 mg by mouth daily., Disp: , Rfl:  .  loratadine (CLARITIN) 10 MG tablet, Take 10 mg by mouth daily., Disp: , Rfl:  .  metFORMIN (GLUCOPHAGE-XR) 500 MG 24 hr tablet, Take 500 mg by mouth 2 (two) times daily., Disp: , Rfl: 1 .  Multiple Vitamin (MULTIVITAMIN WITH MINERALS) TABS tablet, Take 1 tablet by mouth daily., Disp: , Rfl:  .  Omega-3 Fatty Acids (OMEGA 3 500 PO), Take 1 capsule by mouth daily., Disp: , Rfl:  .  pioglitazone (ACTOS) 45 MG tablet, Take 45 mg by mouth daily., Disp: , Rfl:  .  rosuvastatin (CRESTOR) 20 MG tablet, Take 20 mg by mouth daily., Disp: , Rfl:    Garner Nash, DO [facility] 10/30/2018 2:40 PM

## 2018-10-31 ENCOUNTER — Telehealth: Payer: Self-pay | Admitting: Pulmonary Disease

## 2018-10-31 DIAGNOSIS — R0602 Shortness of breath: Secondary | ICD-10-CM

## 2018-10-31 MED ORDER — FUROSEMIDE 20 MG PO TABS: 20.0000 mg | ORAL_TABLET | Freq: Every day | ORAL | 0 refills | Status: DC

## 2018-10-31 NOTE — Telephone Encounter (Signed)
Called and spoke with patient she stated that her legs are still really hard, and sore to the touch due to Edema, she is wanting to know if BI was to send in a prescription of Lasix.   Due to BI being unavailable SG please advise, thank you.

## 2018-10-31 NOTE — Telephone Encounter (Signed)
She would need to be seen before getting lasix. I looked at Dr. Marjory LiesIcards last note . He didn't say anything about a lasix prescription. Her last BMET shows an elevated creatinine and that was in August. He had referred her back to cards to evaluate for pulmonary HTN.

## 2018-10-31 NOTE — Telephone Encounter (Signed)
She needs to be seen. I do not know her

## 2018-10-31 NOTE — Telephone Encounter (Signed)
PCCM:  We can start 20mg  oral lasix. She needs follow up BMET either Friday or Monday with another one 1 week later. Ask her to stop her HCTZ in the mean time.   You can tell her I spoke with Dr. Rennis GoldenHilty yesterday and his office will be in contact about [facility] the [facility].   Thanks  BLI   CC: Dr. Rennis GoldenHilty [facility](FYI)

## 2018-10-31 NOTE — Telephone Encounter (Signed)
Called and spoke to patient, made aware of BI recommendations. Aware to stop HCTZ. Confirmed pharmacy. Order sent. Lab order placed and patient verbalizes she will go to the [place] location Monday for her lab work. Voiced understanding. Nothing further is needed at this time.

## 2018-11-05 ENCOUNTER — Other Ambulatory Visit (INDEPENDENT_AMBULATORY_CARE_PROVIDER_SITE_OTHER): Payer: Medicare Other

## 2018-11-05 DIAGNOSIS — R0602 Shortness of breath: Secondary | ICD-10-CM

## 2018-11-05 LAB — BASIC METABOLIC PANEL
BUN: 34 mg/dL — AB (ref 6–23)
CALCIUM: 10.1 mg/dL (ref 8.4–10.5)
CHLORIDE: 99 meq/L (ref 96–112)
CO2: 28 meq/L (ref 19–32)
CREATININE: 1.38 mg/dL — AB (ref 0.40–1.20)
GFR: 39.38 mL/min — ABNORMAL LOW (ref >60.00)
Glucose, Bld: 171 mg/dL — ABNORMAL HIGH (ref 70–99)
Potassium: 4.3 mEq/L (ref 3.5–5.1)
Sodium: 138 mEq/L (ref 135–145)

## 2018-11-06 ENCOUNTER — Inpatient Hospital Stay (HOSPITAL_COMMUNITY): Admission: RE | Admit: 2018-11-06 | Payer: Medicare Other | Source: Ambulatory Visit

## 2018-11-06 NOTE — Progress Notes (Signed)
Order for lab placed. Nothing further is needed at this time.

## 2018-11-07 ENCOUNTER — Ambulatory Visit (HOSPITAL_COMMUNITY): Payer: Medicare Other

## 2018-11-15 ENCOUNTER — Encounter (HOSPITAL_COMMUNITY): Payer: Self-pay | Admitting: Internal Medicine

## 2018-11-15 ENCOUNTER — Telehealth: Payer: Self-pay | Admitting: Pulmonary Disease

## 2018-11-15 NOTE — Telephone Encounter (Signed)
Continue lasix, labs next week is fine

## 2018-11-15 NOTE — Telephone Encounter (Signed)
Called and spoke with patient, she is aware and verbalized understanding. Nothing further needed.  

## 2018-11-15 NOTE — Telephone Encounter (Signed)
Spoke with pt, she states [facility] recommended by Dr. Tonia BroomsICard. She wanted to know if she should stop the Lasix? [place] can [place] [facility] [place] work on Tues, Wed, or thurs of next week. DOD please advise, Dr. Tonia BroomsICard [facility] nor rounding at the [facility].   Notes recorded by Josephine IgoIcard, Bradley L, DO on 11/05/2018 at 5:56 PM EST [name], you can let her know that her kidney function is stable and that she can continue her lasix. Planned follow up on the kidney function in one week with a repeat BMET. I believe this has been ordered already from last week.   Thanks,  BLI  Josephine IgoBradley L Icard, DO [facility] 11/05/2018 5:55 PM

## 2018-11-16 ENCOUNTER — Institutional Professional Consult (permissible substitution): Payer: Medicare Other | Admitting: Pulmonary Disease

## 2018-11-16 ENCOUNTER — Ambulatory Visit: Payer: Medicare Other | Admitting: Internal Medicine

## 2018-11-19 ENCOUNTER — Telehealth: Payer: Self-pay

## 2018-11-19 NOTE — Telephone Encounter (Signed)
New message    Just an FYI. We have made several attempts to contact this patient including sending a letter to schedule or reschedule their echocardiogram. We will be removing the patient from the echo WQ.   Thank you 

## 2018-11-19 NOTE — Telephone Encounter (Signed)
Will forward to Jenna E RN with Dr Hilty 

## 2018-11-21 ENCOUNTER — Other Ambulatory Visit (INDEPENDENT_AMBULATORY_CARE_PROVIDER_SITE_OTHER): Payer: Medicare Other

## 2018-11-21 DIAGNOSIS — R0602 Shortness of breath: Secondary | ICD-10-CM

## 2018-11-21 LAB — BASIC METABOLIC PANEL
BUN: 32 mg/dL — AB (ref 6–23)
CALCIUM: 9.8 mg/dL (ref 8.4–10.5)
CHLORIDE: 104 meq/L (ref 96–112)
CO2: 26 meq/L (ref 19–32)
CREATININE: 1.5 mg/dL — AB (ref 0.40–1.20)
GFR: 35.76 mL/min — ABNORMAL LOW (ref >60.00)
Glucose, Bld: 129 mg/dL — ABNORMAL HIGH (ref 70–99)
Potassium: 4.3 mEq/L (ref 3.5–5.1)
Sodium: 138 mEq/L (ref 135–145)

## 2018-11-30 ENCOUNTER — Telehealth: Payer: Self-pay | Admitting: Pulmonary Disease

## 2018-11-30 MED ORDER — FUROSEMIDE 20 MG PO TABS: 20.0000 mg | ORAL_TABLET | Freq: Every day | ORAL | 0 refills | Status: DC

## 2018-11-30 NOTE — Telephone Encounter (Signed)
Called and spoke with patient, she stated that she was suppose to have her prescription of lasix sent to the pharmacy but it was not sent. Advised patient that I would send it in. Refill sent. Nothing further needed.

## 2018-12-10 ENCOUNTER — Ambulatory Visit: Payer: Medicare Other | Admitting: Pulmonary Disease

## 2018-12-10 ENCOUNTER — Encounter: Payer: Self-pay | Admitting: Pulmonary Disease

## 2018-12-10 VITALS — BP 112/88 | HR 77 | Ht 67.5 in | Wt 257.0 lb

## 2018-12-10 DIAGNOSIS — R0683 Snoring: Secondary | ICD-10-CM | POA: Diagnosis not present

## 2018-12-10 NOTE — Progress Notes (Signed)
       Claire Blake    [id number]    09/27/1941  Primary Care Physician:McNeill, Wendy, MD  Referring Physician: McNeill, Wendy, MD [address] [place], [place] [address]  Chief complaint:   Patient being seen for history of snoring  HPI:  Patient with a history of snoring, come to severe sometimes Her sleep habits is quite erratic Does not feel sleepy during the day Denies any dryness of her mouth in the mornings, no headaches Denies any shortness of breath at night, no snorting respirations  She does have chronic back issues  She usually goes to bed early to late evening but no specific time, awakening time is usually very flexible as well   Never smoker  Outpatient Encounter Medications as of 12/10/2018  Medication Sig  . amLODipine (NORVASC) 2.5 MG tablet Take 2.5 mg by mouth daily.  . aspirin EC 81 MG tablet Take 81 mg by mouth daily.  . Blood Glucose Monitoring Suppl (ONE TOUCH ULTRA SYSTEM KIT) W/DEVICE KIT 1 kit by Does not apply route once.  . calcium carbonate (OSCAL) 1500 (600 Ca) MG TABS tablet Take 600 mg of elemental calcium by mouth 2 (two) times daily with a meal.  . Cholecalciferol (VITAMIN D) 2000 units CAPS Take 2,000 Units by mouth daily.  . furosemide (LASIX) 20 MG tablet Take 1 tablet (20 mg total) by mouth daily.  . glimepiride (AMARYL) 4 MG tablet Take 2 mg by mouth 2 (two) times daily.  . levothyroxine (SYNTHROID, LEVOTHROID) 88 MCG tablet Take 88 mcg by mouth daily before breakfast.  . lisinopril (PRINIVIL,ZESTRIL) 40 MG tablet Take 40 mg by mouth daily.  . loratadine (CLARITIN) 10 MG tablet Take 10 mg by mouth daily.  . metFORMIN (GLUCOPHAGE-XR) 500 MG 24 hr tablet Take 500 mg by mouth 2 (two) times daily.  . Multiple Vitamin (MULTIVITAMIN WITH MINERALS) TABS tablet Take 1 tablet by mouth daily.  . Omega-3 Fatty Acids (OMEGA 3 500 PO) Take 1 capsule by mouth daily.  . pioglitazone (ACTOS) 45 MG tablet Take 45 mg by mouth daily.  .  rosuvastatin (CRESTOR) 20 MG tablet Take 20 mg by mouth daily.  . [DISCONTINUED] hydrochlorothiazide (HYDRODIURIL) 25 MG tablet Take 25 mg by mouth daily.   No facility-administered encounter medications on file as of 12/10/2018.     Allergies as of 12/10/2018 - Review Complete 12/10/2018  Allergen Reaction Noted  . Zocor [simvastatin]  01/02/2014    Past Medical History:  Diagnosis Date  . Anemia    hx  . Bronchitis    hx  . CKD (chronic kidney disease), stage III (HCC)   . Diabetes mellitus without complication ([facility])   . Glaucoma   . Hyperlipidemia   . Hypertension   . Hypothyroidism   . Obesity   . Pneumonia    hx  . Rheumatic fever    78 yrs old  . Thyroid disease     Past Surgical History:  Procedure Laterality Date  . ABDOMINAL HYSTERECTOMY     partial  . ANKLE FRACTURE SURGERY    . BACK SURGERY  2006   ruptured disc  . CHOLECYSTECTOMY N/A 01/16/2014   Procedure: LAPAROSCOPIC CHOLECYSTECTOMY WITH INTRAOPERATIVE CHOLANGIOGRAM;  Surgeon: Matthew K. Tsuei, MD;  Location: [facility] OR;  Service: General;  Laterality: N/A;  . ESOPHAGOGASTRODUODENOSCOPY N/A 01/16/2014   Procedure: ESOPHAGOGASTRODUODENOSCOPY (EGD);  Surgeon: Robert V Buccini, MD;  Location: [facility] ENDOSCOPY;  Service: Endoscopy;  Laterality: N/A;    Family   History  Problem Relation Age of Onset  . COPD Mother   . Cancer Father        throat  . Cancer Maternal Grandmother        bilateral breast  . Breast cancer Maternal Grandmother 71    Social History   Socioeconomic History  . Marital status: Divorced    Spouse name: Not on file  . Number of children: Not on file  . Years of education: Not on file  . Highest education level: Not on file  Occupational History  . Not on file  Social Needs  . Financial resource strain: Not on file  . Food insecurity:    Worry: Not on file    Inability: Not on file  . Transportation needs:    Medical: Not on file    Non-medical: Not on file  Tobacco Use  .  Smoking status: Never Smoker  . Smokeless tobacco: Never Used  Substance and Sexual Activity  . Alcohol use: No  . Drug use: No  . Sexual activity: Not on file  Lifestyle  . Physical activity:    Days per week: Not on file    Minutes per session: Not on file  . Stress: Not on file  Relationships  . Social connections:    Talks on phone: Not on file    Gets together: Not on file    Attends religious service: Not on file    Active member of club or organization: Not on file    Attends meetings of clubs or organizations: Not on file    Relationship status: Not on file  . Intimate partner violence:    Fear of current or ex partner: Not on file    Emotionally abused: Not on file    Physically abused: Not on file    Forced sexual activity: Not on file  Other Topics Concern  . Not on file  Social History Narrative  . Not on file    Review of Systems  Constitutional: Negative.   HENT: Negative.   Eyes: Negative.   Respiratory: Negative.  Negative for apnea.   Cardiovascular: Negative.  Negative for chest pain.  Gastrointestinal: Negative.   Psychiatric/Behavioral: Positive for sleep disturbance.    Vitals:   12/10/18 1356  BP: 112/88  Pulse: 77  SpO2: 96%     Physical Exam  Constitutional: She appears well-developed and well-nourished.  HENT:  Head: Normocephalic and atraumatic.  Crowded oropharynx, Mallampati 4  Eyes: Pupils are equal, round, and reactive to light. Conjunctivae are normal. Right eye exhibits no discharge. Left eye exhibits no discharge.  Neck: Normal range of motion. Neck supple. No tracheal deviation present. No thyromegaly present.  Cardiovascular: Normal rate and regular rhythm.  Pulmonary/Chest: Effort normal and breath sounds normal. No respiratory distress. She has no wheezes. She has no rales. She exhibits no tenderness.  Abdominal: Soft. Bowel sounds are normal. She exhibits no distension. There is no abdominal tenderness.   Results of the  Epworth flowsheet 12/10/2018  Sitting and reading 0  Watching TV 0  Sitting, inactive in a public place (e.g. a theatre or a meeting) 0  As a passenger in a car for an hour without a break 0  Lying down to rest in the afternoon when circumstances permit 1  Sitting and talking to someone 0  Sitting quietly after a lunch without alcohol 0  In a car, while stopped for a few minutes in traffic 0  Total score 1  Assessment:   Moderate probability of significant sleep disordered breathing  Morbid obesity  Heavy snoring history  Epworth Sleepiness Scale is low, erratic sleeping habits however, denies any significant daytime sleepiness at present  Plan/Recommendations:  Importance of weight loss discussed with the patient -She is limited by back pain and inability to exercise  Pathophysiology of sleep disordered breathing discussed with patient  Treatment options of sleep disordered breathing discussed with the patient  I will see her back in the office in 3 months  Adewale Olalere MD [facility] 12/10/2018, 2:05 PM  CC: McNeill, Wendy, MD   

## 2018-12-10 NOTE — Patient Instructions (Signed)
Moderate probability of obstructive sleep apnea  We will set you up with a home sleep study Will call you to update you with results  I will see you back in the office in about 3 months  Encourage you to continue with weight loss efforts  Sleep Apnea Sleep apnea affects breathing during sleep. It causes breathing to stop for a short time or to become shallow. It can also increase the risk of:  Heart attack.  Stroke.  Being very overweight (obese).  Diabetes.  Heart failure.  Irregular heartbeat. The goal of treatment is to help you breathe normally again. What are the causes? There are three kinds of sleep apnea:  Obstructive sleep apnea. This is caused by a blocked or collapsed airway.  Central sleep apnea. This happens when the brain does not send the right signals to the muscles that control breathing.  Mixed sleep apnea. This is a combination of obstructive and central sleep apnea. The most common cause of this condition is a collapsed or blocked airway. This can happen if:  Your throat muscles are too relaxed.  Your tongue and tonsils are too large.  You are overweight.  Your airway is too small. What increases the risk?  Being overweight.  Smoking.  Having a small airway.  Being older.  Being female.  Drinking alcohol.  Taking medicines to calm yourself (sedatives or tranquilizers).  Having family members with the condition. What are the signs or symptoms?  Trouble staying asleep.  Being sleepy or tired during the day.  Getting angry a lot.  Loud snoring.  Headaches in the morning.  Not being able to focus your mind (concentrate).  Forgetting things.  Less interest in sex.  Mood swings.  Personality changes.  Feelings of sadness (depression).  Waking up a lot during the night to pee (urinate).  Dry mouth.  Sore throat. How is this diagnosed?  Your medical history.  A physical exam.  A test that is done when you are  sleeping (sleep study). The test is most often done in a sleep lab but may also be done at home. How is this treated?   Sleeping on your side.  Using a medicine to get rid of mucus in your nose (decongestant).  Avoiding the use of alcohol, medicines to help you relax, or certain pain medicines (narcotics).  Losing weight, if needed.  Changing your diet.  Not smoking.  Using a machine to open your airway while you sleep, such as: ? An oral appliance. This is a mouthpiece that shifts your lower jaw forward. ? A CPAP device. This device blows air through a mask when you breathe out (exhale). ? An EPAP device. This has valves that you put in each nostril. ? A BPAP device. This device blows air through a mask when you breathe in (inhale) and breathe out.  Having surgery if other treatments do not work. It is important to get treatment for sleep apnea. Without treatment, it can lead to:  High blood pressure.  Coronary artery disease.  In men, not being able to have an erection (impotence).  Reduced thinking ability. Follow these instructions at home: Lifestyle  Make changes that your doctor recommends.  Eat a healthy diet.  Lose weight if needed.  Avoid alcohol, medicines to help you relax, and some pain medicines.  Do not use any products that contain nicotine or tobacco, such as cigarettes, e-cigarettes, and chewing tobacco. If you need help quitting, ask your doctor. General instructions  Take over-the-counter and prescription medicines only as told by your doctor.  If you were given a machine to use while you sleep, use it only as told by your doctor.  If you are having surgery, make sure to tell your doctor you have sleep apnea. You may need to bring your device with you.  Keep all follow-up visits as told by your doctor. This is important. Contact a doctor if:  The machine that you were given to use during sleep bothers you or does not seem to be working.  You  do not get better.  You get worse. Get help right away if:  Your chest hurts.  You have trouble breathing in enough air.  You have an uncomfortable feeling in your back, arms, or stomach.  You have trouble talking.  One side of your body feels weak.  A part of your face is hanging down. These symptoms may be an emergency. Do not wait to see if the symptoms will go away. Get medical help right away. Call your local emergency services (911 in the [place] Do not drive yourself to the hospital. Summary  This condition affects breathing during sleep.  The most common cause is a collapsed or blocked airway.  The goal of treatment is to help you breathe normally while you sleep. This information is not intended to replace advice given to you by your health care provider. Make sure you discuss any questions you have with your health care provider. Document Released: 08/30/2008 Document Revised: 07/17/2018 Document Reviewed: 07/17/2018 Elsevier Interactive Patient Education  [employer]2019 Elsevier Inc.

## 2018-12-24 ENCOUNTER — Encounter: Payer: Self-pay | Admitting: Internal Medicine

## 2018-12-24 ENCOUNTER — Ambulatory Visit: Payer: Medicare Other | Admitting: Internal Medicine

## 2018-12-24 VITALS — BP 140/65 | HR 73 | Ht 67.5 in | Wt 263.0 lb

## 2018-12-24 DIAGNOSIS — R0602 Shortness of breath: Secondary | ICD-10-CM | POA: Diagnosis not present

## 2018-12-24 DIAGNOSIS — I2584 Coronary atherosclerosis due to calcified coronary lesion: Secondary | ICD-10-CM

## 2018-12-24 DIAGNOSIS — I2510 Atherosclerotic heart disease of native coronary artery without angina pectoris: Secondary | ICD-10-CM

## 2018-12-24 DIAGNOSIS — I2721 Secondary pulmonary arterial hypertension: Secondary | ICD-10-CM

## 2018-12-24 DIAGNOSIS — G4733 Obstructive sleep apnea (adult) (pediatric): Secondary | ICD-10-CM

## 2018-12-24 NOTE — Progress Notes (Signed)
OFFICE CONSULT NOTE  Chief Complaint:  Follow-up stress test  Primary Care Physician: Cari Caraway, MD  HPI:  Claire Blake is a 78 y.o. female who is being seen today for the evaluation of shortness of breath at the request of Cari Caraway, MD.  This is a pleasant 78 year old female who is originally from the [place] area.  She presents with several weeks of progressive shortness of breath.  She has a history of episodes of bronchitis but no known COPD.  Her mother had COPD and died from this in her late 47s however she reports not being a smoker.  Recently she underwent work-up by her PCP including a d-dimer which was abnormal.  This led to lower extremity arterial Dopplers which were negative for DVT and a CT pulmonary angiogram which demonstrated no PE, however she was noted to have age advanced coronary atherosclerosis and borderline cardiomegaly without pericardial effusion or pleural effusions.  An EKG was performed in our office which indicated possible inferior infarct pattern which I personally reviewed and therefore she was referred for cardiovascular evaluation.  Ms. Blake denies any chest pain, but reports exertional dyspnea.  She says she sleeps upright in a recliner but has for years.  She denies orthopnea, PND or other heart failure symptoms but has had lower extremity edema and recent weight gain.  09/26/2018  Claire Blake returns today for follow-up of her shortness of breath.  She underwent an echocardiogram which showed normal systolic function mild diastolic dysfunction.  BNP was low and metabolic profile was fairly normal.  Weight is up a few more pounds since she was last seen however blood pressure is well controlled today.  She reports some improvement in her dizziness and no worsening of her shortness of breath.  She denies any chest pain.  Symptoms could be coronary ischemia however she has had no typical symptoms of that.  12/24/2018  Claire Blake is seen  today in follow-up.  She was supposed to follow-up on nuclear stress testing given no significant improvement in her shortness of breath.  She had been referred to pulmonary and saw Dr. Valeta Harms.  He performed CT scan of the chest which did show a nodule and there was pulmonary artery enlargement concerning for pulmonary hypertension but no other significant parenchymal disease.  He message me about these findings and we discussed the possibility of work-up for pulmonary hypertension.  On her echo unfortunately she did not have any significant tricuspid regurgitation to estimate pulmonary pressure.  She was supposed to have a stress test but had to cancel that due to bouts of diverticulitis and bronchitis in December.  She says she is now recovering from that.  Based on the need for invasive work-up to assess for pulmonary hypertension however I am now recommending left and right heart catheterization.  PMHx:  Past Medical History:  Diagnosis Date  . Anemia    hx  . Bronchitis    hx  . CKD (chronic kidney disease), stage III ([place])   . Diabetes mellitus without complication ([place])   . Glaucoma   . Hyperlipidemia   . Hypertension   . Hypothyroidism   . Obesity   . Pneumonia    hx  . Rheumatic fever    78 yrs old  . Thyroid disease     Past Surgical History:  Procedure Laterality Date  . ABDOMINAL HYSTERECTOMY     partial  . ANKLE FRACTURE SURGERY    . BACK SURGERY  2006  ruptured disc  . CHOLECYSTECTOMY N/A 01/16/2014   Procedure: LAPAROSCOPIC CHOLECYSTECTOMY WITH INTRAOPERATIVE CHOLANGIOGRAM;  Surgeon: Imogene Burn. Georgette Dover, MD;  Location: [facility];  Service: General;  Laterality: N/A;  . ESOPHAGOGASTRODUODENOSCOPY N/A 01/16/2014   Procedure: ESOPHAGOGASTRODUODENOSCOPY (EGD);  Surgeon: Cleotis Nipper, MD;  Location[facility] END[facility]COPY;  Service: Endoscopy;  Laterality: N/A;    FAMHx:  Family History  Problem Relation Age of Onset  . COPD Mother   . Cancer Father        throat  . Cancer  Maternal Grandmother        bilateral breast  . Breast cancer Maternal Grandmother 6    SOCHx:   reports that she has never smoked. She has never used smokeless tobacco. She reports that she does not drink alcohol or use drugs.  ALLERGIES:  Allergies  Allergen Reactions  . Zocor [Simvastatin]     Leg cramps    ROS: Pertinent items noted in HPI and remainder of comprehensive ROS otherwise negative.  HOME MEDS: Current Outpatient Medications on File Prior to Visit  Medication Sig Dispense Refill  . amLODipine (NORVASC) 2.5 MG tablet Take 2.5 mg by mouth daily.    Marland Kitchen aspirin EC 81 MG tablet Take 81 mg by mouth daily.    . Blood Glucose Monitoring Suppl (ONE TOUCH ULTRA SYSTEM KIT) W/DEVICE KIT 1 kit by Does not apply route once.    . calcium carbonate (OSCAL) 1500 (600 Ca) MG TABS tablet Take 600 mg of elemental calcium by mouth 2 (two) times daily with a meal.    . Cholecalciferol (VITAMIN D) 2000 units CAPS Take 2,000 Units by mouth daily.    . furosemide (LASIX) 20 MG tablet Take 1 tablet (20 mg total) by mouth daily. 30 tablet 0  . glimepiride (AMARYL) 4 MG tablet Take 2 mg by mouth 2 (two) times daily.    Marland Kitchen levothyroxine (SYNTHROID, LEVOTHROID) 88 MCG tablet Take 88 mcg by mouth. Take 1/2 tablet by mouth 1 day a week and 1 whole tablet 6 days a week    . lisinopril (PRINIVIL,ZESTRIL) 40 MG tablet Take 40 mg by mouth daily.    Marland Kitchen loratadine (CLARITIN) 10 MG tablet Take 10 mg by mouth daily.    . metFORMIN (GLUCOPHAGE-XR) 500 MG 24 hr tablet Take 500 mg by mouth 2 (two) times daily.  1  . Omega-3 Fatty Acids (OMEGA 3 500 PO) Take 1 capsule by mouth daily.    . pioglitazone (ACTOS) 45 MG tablet Take 45 mg by mouth daily.    . rosuvastatin (CRESTOR) 20 MG tablet Take 20 mg by mouth daily.     No current facility-administered medications on file prior to visit.     LABS/IMAGING: No results found for this or any previous visit (from the past 48 hour(s)). No results found.  LIPID  PANEL: No results found for: CHOL, TRIG, HDL, CHOLHDL, VLDL, LDLCALC, LDLDIRECT  WEIGHTS: Wt Readings from Last 3 Encounters:  12/24/18 263 lb (119.3 kg)  12/10/18 257 lb (116.6 kg)  10/30/18 270 lb 3.2 oz (122.6 kg)    VITALS: BP 140/65   Pulse 73   Ht 5' 7.5" (1.715 m)   Wt 263 lb (119.3 kg)   BMI 40.58 kg/m   EXAM: General appearance: alert, no distress and morbidly obese Lungs: clear to auscultation bilaterally Heart: regular rate and rhythm, S1, S2 normal, no murmur, click, rub or gallop Extremities: extremities normal, atraumatic, no cyanosis or edema Neurologic: Grossly normal  EKG: Deferred  ASSESSMENT: 1. Progressive dyspnea on exertion -will LVEF 55 to 71%, grade 1 diastolic dysfunction (69/6789) 2. Pulmonary artery enlargement on CT 3. Morbid obesity 4. Multivessel coronary artery calcification 5. Type 2 diabetes 6. Hypertension 7. Hyperlipidemia 8. CKD 3  PLAN: 1.   Claire Blake has evidence of pulmonary artery enlargement on CT and progressive dyspnea on exertion with only mild abnormalities on echo.  She also has multivessel coronary artery calcification.  Her symptoms may be attributable to pulmonary hypertension.  She saw Dr. Valeta Harms in pulmonary who had recommended a sleep study.  This is pending and if she has apnea obviously treatment is warranted.  From a cardiac standpoint I think she needs coronary artery evaluation.  I was proposing a stress test but however since this was canceled due to her being ill and we will likely need to evaluate for pulmonary hypertension, now recommending left and right heart catheterization.  I discussed the risk, benefits and alternatives of cardiac catheterization today and she is understanding of those risks and willing to proceed.  Follow-up with me afterwards.  Pixie Casino, MD, [facility], [place] Director of the Advanced Lipid Disorders &  Cardiovascular Risk Reduction  Clinic Diplomate of the American Board of Clinical Lipidology Attending Cardiologist  Direct Dial: [phone]  Fax: [phone]  Website:  [email or web address] 12/24/2018, 3:43 PM

## 2018-12-24 NOTE — Patient Instructions (Addendum)
Medication Instructions:  NO CHANGES If you need a refill on your cardiac medications before your next appointment, please call your pharmacy.   Lab work: Press photographer lab work (CBC, BMET) to be completed prior to procedure should you decide to proceed with this  If you have labs (blood work) drawn today and your tests are completely normal, you will receive your results only by: Marland Kitchen MyChart Message (if you have MyChart) OR . A paper copy in the mail If you have any lab test that is abnormal or we need to change your treatment, we will call you to review the results.  Testing/Procedures: Dr. Rennis Golden has ordered a RIGHT & LEFT heart cath. This is done at [facility]. Instructions noted below.   Please call our office if you would like to schedule this. You can ask for [facility] or a triage nurse.  Follow-Up: At [facility], you and your health needs are our priority.  As part of our continuing mission to provide you with exceptional heart care, we have created designated Provider Care Teams.  These Care Teams include your primary Cardiologist (physician) and Advanced Practice Providers (APPs -  Physician Assistants and Nurse Practitioners) who all work together to provide you with the care you need, when you need it. You will need a follow up appointment in 3 months. You may see Chrystie Nose, MD or one of the following Advanced Practice Providers on your designated Care Team: [place], [place] . Micah Flesher, PA-C  Any Other Special Instructions Will Be Listed Below (If Applicable).      [facility] CARDIOVASCULAR DIVISION [facility] [place] [address] [place] [address] Dept: [phone] Loc: [phone]  DODY DINGA  12/24/2018  You are scheduled for a Cardiac Catheterization on ______________, ________________  with Dr. ____________.  1. Please arrive at the [facility] (Main Entrance A) at [facility]: [address] [place], [place] [address] at _________ (This time is two hours before your procedure to ensure your preparation). Free valet parking service is available.   Special note: Every effort is made to have your procedure done on time. Please understand that emergencies sometimes delay scheduled procedures.  2. Diet: Do not eat solid foods after midnight.  You may have clear liquids until 5am upon the day of the procedure.  3. Labs: You will need to have blood drawn prior to procedure (CBC, BMET). You will NOT need to be fasting.  4. Medication instructions in preparation for your procedure:  HOLD furosemide (lasix) the day of procedure  HOLD all diabetic medications the day of procedure  HOLD metformin for 48 hours post-procedure   On the morning of your procedure, take your Aspirin and any morning medicines NOT listed above.  You may use sips of water.  5. Plan for one night stay--bring personal belongings. 6. Bring a current list of your medications and current insurance cards. 7. You MUST have a responsible person to drive you home. 8. Someone MUST be with you the first 24 hours after you arrive home or your discharge will be delayed. 9. Please wear clothes that are easy to get on and off and wear slip-on shoes.  Thank you for allowing [place] to care for you!   -- [place] [facility]

## 2018-12-25 ENCOUNTER — Other Ambulatory Visit: Payer: Self-pay | Admitting: *Deleted

## 2018-12-25 ENCOUNTER — Encounter: Payer: Self-pay | Admitting: Internal Medicine

## 2018-12-25 DIAGNOSIS — R0683 Snoring: Secondary | ICD-10-CM

## 2019-01-02 ENCOUNTER — Telehealth: Payer: Self-pay | Admitting: Pulmonary Disease

## 2019-01-02 DIAGNOSIS — G4733 Obstructive sleep apnea (adult) (pediatric): Secondary | ICD-10-CM

## 2019-01-02 DIAGNOSIS — G4734 Idiopathic sleep related nonobstructive alveolar hypoventilation: Secondary | ICD-10-CM

## 2019-01-03 ENCOUNTER — Telehealth: Payer: Self-pay | Admitting: Internal Medicine

## 2019-01-03 NOTE — Telephone Encounter (Signed)
Per MD, sedation is controlled by cath MD, patient will need to be able to maintain airway. Sedation received is generally moderate sedation but can vary

## 2019-01-03 NOTE — Telephone Encounter (Signed)
Spoke with patient. She has decided she wants to try to get cardiac cath scheduled for 01/09/19. Patient stated she wanted to be "put under where I won't remember anything", she needs name of doctor and anaesthesiologist involved with cath, and amount of time she will be at hospital day of procedure. Did call cath lab nothing available on 2/5 Will forward to Herb Grays RN with Dr Rennis Golden

## 2019-01-03 NOTE — Telephone Encounter (Signed)
Dr. Wynona Neat has reviewed the home sleep test this showed Mild osa with moderate oxygen desaturations .   Recommendations   Sleep position optimization by encouraging sleep in the lateral poittion , eveating the head of the bed by 30 degrees may help. Optimize sleep by getting at least 7 hours of sleep. Cpap therapy maybe consider if the patient has significant daytime sympotoms  That can be ascribed to  An effect of sleep disordered breathing    Treatment options maybe CPAP with the settings auto 5 to 15.          Moderate oxygen desaturation will require a ono if they patient decided's not to go with cpap Regular exercise will help promote good quality sleep  .    Advise against driving while sleepy & against medication with sedative side effects.      Make appointment for 3 months for compliance with download with Dr. Wynona Neat. If patient choose a cpap therapy. Other wise as needed.    Patient States she is not having any symptoms at this time so she does not want to start cpap but she is okay with doing the ono.l have placed this order nothing further needed at this time.

## 2019-01-03 NOTE — Telephone Encounter (Signed)
New message   Patient needs information about scheduling for a catheterization. Please call to discuss.

## 2019-01-03 NOTE — Telephone Encounter (Signed)
New Message   Patient calling back to give dates she's available for appt which is February 6th and the 11th.

## 2019-01-03 NOTE — Telephone Encounter (Signed)
Spoke with patient of Dr. Rennis Golden who needs R & L heart cath. Explained that Claire Blake was notified by cath lab that no openings are available on 2/5 for outpatient caths. She will check with her daughter to see if there is another time they could schedule. Advised her that generally Mondays are not the best either. Explained she can use MyChart for quicker communication and she will try this route.

## 2019-01-04 ENCOUNTER — Other Ambulatory Visit: Payer: Self-pay | Admitting: *Deleted

## 2019-01-04 DIAGNOSIS — R0602 Shortness of breath: Secondary | ICD-10-CM

## 2019-01-04 DIAGNOSIS — I2584 Coronary atherosclerosis due to calcified coronary lesion: Principal | ICD-10-CM

## 2019-01-04 DIAGNOSIS — I2510 Atherosclerotic heart disease of native coronary artery without angina pectoris: Secondary | ICD-10-CM

## 2019-01-04 DIAGNOSIS — I2721 Secondary pulmonary arterial hypertension: Secondary | ICD-10-CM

## 2019-01-04 NOTE — Telephone Encounter (Signed)
Patient has received message via MyChart

## 2019-01-04 NOTE — Telephone Encounter (Signed)
Spoke with Claire Blake in cath lab.  Scheduled patient for right & left heart cath on Feb 6 @ noon with Dr. Peter [place] - 10am arrival time Patient will need labs prior  Patient notified via MyChart

## 2019-01-07 ENCOUNTER — Telehealth: Payer: Self-pay | Admitting: Internal Medicine

## 2019-01-07 NOTE — Telephone Encounter (Signed)
New Message:3      Pt wants to cancel procedure for Thursday. She will reschedule later.

## 2019-01-07 NOTE — Telephone Encounter (Signed)
Spoke with patient about cancelling R&L heart cath procedure. She states she woke up with sore throat this AM and her daughter (who was her ride) will not be getting in from out of town until Friday. She does plan to call back to r/s at a later date.   Call placed to cath lab to cancel scheduled procedure

## 2019-01-07 NOTE — Telephone Encounter (Signed)
Patients is scheduled for a cardiac cath on 01/10/19. Message fwd to Dr.Hilty and his nurse Eileen Stanford, RN.

## 2019-01-10 ENCOUNTER — Encounter (HOSPITAL_COMMUNITY): Payer: Self-pay

## 2019-01-10 ENCOUNTER — Ambulatory Visit (HOSPITAL_COMMUNITY): Admit: 2019-01-10 | Payer: Medicare Other | Admitting: Cardiology

## 2019-01-10 SURGERY — RIGHT/LEFT HEART CATH AND CORONARY ANGIOGRAPHY
Anesthesia: LOCAL

## 2019-01-14 ENCOUNTER — Telehealth: Payer: Self-pay | Admitting: Pulmonary Disease

## 2019-01-14 NOTE — Telephone Encounter (Signed)
ATC patient , unable to reach. Left message for patient to call back. Patient needs to call [facility] and see if they have the machine ready for pick up.

## 2019-01-15 NOTE — Telephone Encounter (Signed)
Called and left message for Claire Blake [facility] to call back re guarding ONO scheduling. Called and spoke with Patient. She understands we are waiting on [facility] to return our call.  Patient did request a copy of sleep results to be mailed to her. Results printed and placed in envelope to be mailed.

## 2019-01-15 NOTE — Telephone Encounter (Signed)
Called and spoke with Patient.  Per [place], [facility], ONO will be delivered 01/17/19.  Patient stated [facility] had contacted her.  Nothing further at this time.

## 2019-01-15 NOTE — Telephone Encounter (Signed)
Boneta Lucks from [facility] is calling back [phone] The ONO is being delivered on 2/13

## 2019-01-15 NOTE — Telephone Encounter (Signed)
Called patient, unable to reach left message to give us a call back. 

## 2019-01-15 NOTE — Telephone Encounter (Signed)
Pt is calling back [phone]

## 2019-01-17 ENCOUNTER — Other Ambulatory Visit: Payer: Self-pay | Admitting: Family Medicine

## 2019-01-17 DIAGNOSIS — Z1231 Encounter for screening mammogram for malignant neoplasm of breast: Secondary | ICD-10-CM

## 2019-01-24 ENCOUNTER — Ambulatory Visit: Payer: Medicare Other | Admitting: Pulmonary Disease

## 2019-01-30 ENCOUNTER — Telehealth: Payer: Self-pay | Admitting: Pulmonary Disease

## 2019-02-01 NOTE — Telephone Encounter (Signed)
Dr. Wynona Neat would like patient to come in next week for apt. To go over ono. Please schedule apt   lmom for patient.

## 2019-02-07 ENCOUNTER — Ambulatory Visit: Payer: Medicare Other | Admitting: Pulmonary Disease

## 2019-02-14 NOTE — Telephone Encounter (Signed)
lmom 

## 2019-02-15 NOTE — Telephone Encounter (Signed)
lmom 

## 2019-02-28 NOTE — Telephone Encounter (Signed)
Patient will see doctor AO in July once thing have  Settled she is aware of need to have cpap titration.

## 2019-03-13 ENCOUNTER — Ambulatory Visit: Payer: Medicare Other

## 2019-03-21 ENCOUNTER — Ambulatory Visit: Payer: Medicare Other | Admitting: Internal Medicine

## 2019-03-27 ENCOUNTER — Ambulatory Visit: Payer: Medicare Other | Admitting: Pulmonary Disease

## 2019-05-14 ENCOUNTER — Ambulatory Visit: Payer: Medicare Other

## 2019-05-29 ENCOUNTER — Ambulatory Visit: Payer: Medicare Other | Admitting: Internal Medicine

## 2019-07-25 ENCOUNTER — Ambulatory Visit: Payer: Medicare Other

## 2019-08-21 ENCOUNTER — Encounter: Payer: Medicare Other | Attending: Family Medicine | Admitting: *Deleted

## 2019-08-21 ENCOUNTER — Other Ambulatory Visit: Payer: Self-pay

## 2019-08-21 DIAGNOSIS — E1121 Type 2 diabetes mellitus with diabetic nephropathy: Secondary | ICD-10-CM

## 2019-08-21 DIAGNOSIS — N183 Chronic kidney disease, stage 3 unspecified: Secondary | ICD-10-CM

## 2019-08-21 NOTE — Patient Instructions (Addendum)
   Fluid: 64 oz per day could be divided into 16 oz per meal = 48 oz with another 16 oz for snacks.  We will discuss protein servings in more detail at our next visit, I'd suggest about 2 oz for breakfast and about 4 oz for dinner meal.   We discussed the diabetes medications you are currently on and what the potential advantage of using insulin might be.   I've provided a list of potasium content of foods for your information, you are not on a potassium restriction currently.

## 2019-08-27 NOTE — Progress Notes (Signed)
Diabetes Self-Management Education  Visit Type: First/Initial  Appt. Start Time: 1130 Appt. End Time: 1230  08/27/2019  [name], identified by name and date of birth, is a 78 y.o. female with a diagnosis of Diabetes: Type 2. Patient states history of Diabetes since 1997 with documentation of A1c history running 6.4 to 7.1% over the past 4 years. She had a recent increase in her A1c ot 8.4 in June this year. She also states increase breathing problems this past year and diagnosis of chronic kidney disease. She is aware of limiting sodium but not sure how much to aim for daily. She would like to discuss and overview of diabetes today as well as nutrition guidelines for both diabetes and renal disease.   ASSESSMENT  There were no vitals taken for this visit. There is no height or weight on file to calculate BMI.  Diabetes Self-Management Education - 08/21/19 1137      Visit Information   Visit Type  First/Initial      Initial Visit   Diabetes Type  Type 2    Are you taking your medications as prescribed?  Yes    Date Diagnosed  1997      Health Coping   How would you rate your overall health?  Fair      Psychosocial Assessment   Patient Belief/Attitude about Diabetes  --   no comment   Other persons present  Patient    Patient Concerns  Nutrition/Meal planning;Glycemic Control;Other (comment)   nutrition for diabetes and renal disease clarified   Special Needs  None    Learning Readiness  Change in progress    How often do you need to have someone help you when you read instructions, pamphlets, or other written materials from your doctor or pharmacy?  1 - Never    What is the last grade level you completed in school?  2 years college      Pre-Education Assessment   Patient understands the diabetes disease and treatment process.  Needs Review    Patient understands incorporating nutritional management into lifestyle.  Needs Review    Patient undertands incorporating  physical activity into lifestyle.  Needs Instruction    Patient understands using medications safely.  Needs Review    Patient understands monitoring blood glucose, interpreting and using results  Needs Review    Patient understands prevention, detection, and treatment of acute complications.  Needs Review    Patient understands prevention, detection, and treatment of chronic complications.  Needs Review    Patient understands how to develop strategies to address psychosocial issues.  Needs Review    Patient understands how to develop strategies to promote health/change behavior.  Needs Review      Complications   Last HgB A1C per patient/outside source  8.4 %    How often do you check your blood sugar?  1-2 times/day    Number of hypoglycemic episodes per month  0    Have you had a dilated eye exam in the past 12 months?  Yes    Have you had a dental exam in the past 12 months?  Yes    Are you checking your feet?  Yes    How many days per week are you checking your feet?  3      Dietary Intake   Breakfast  11 AM or later: egg with cheese, mushrooms and onions. occasionally with English muffin or 1 toast if plain egg.    Lunch  snacks occasionally: bites of low salt chips OR fresh fruit OR bite of Maxie B cake (1 slice lasts several days and no icing)    Dinner  cooks herself: meat or fish, vegetables, occasionally a starch food,    Beverage(s)  coffee with half and half, juice occasionally, water, more coffee, Crystal Light      Exercise   Exercise Type  ADL's    How many days per week to you exercise?  0    How many minutes per day do you exercise?  0    Total minutes per week of exercise  0      Individualized Goals (developed by patient)   Nutrition  Follow meal plan discussed    Physical Activity  15 minutes per day    Medications  take my medication as prescribed    Monitoring   test blood glucose pre and post meals as discussed    Problem Solving  monitor any swelling of  ankles or feet in regard to sodium and fluid intake      Post-Education Assessment   Patient understands the diabetes disease and treatment process.  Demonstrates understanding / competency    Patient understands incorporating nutritional management into lifestyle.  Demonstrates understanding / competency    Patient understands using medications safely.  Demonstrates understanding / competency    Patient understands monitoring blood glucose, interpreting and using results  Demonstrates understanding / competency    Patient understands prevention, detection, and treatment of chronic complications.  Demonstrates understanding / competency      Outcomes   Expected Outcomes  Demonstrated interest in learning. Expect positive outcomes    Future DMSE  4-6 wks    Program Status  Not Completed      Individualized Plan for Diabetes Self-Management Training:   Learning Objective:  Patient will have a greater understanding of diabetes self-management. Patient education plan is to attend individual and/or group sessions per assessed needs and concerns.   Plan:   Patient Instructions   Fluid: 64 oz per day could be divided into 16 oz per meal = 48 oz with another 16 oz for snacks.  We will discuss protein servings in more detail at our next visit, I'd suggest about 2 oz for breakfast and about 4 oz for dinner meal.   We discussed the diabetes medications you are currently on and what the potential advantage of using insulin might be.   I've provided a list of potasium content of foods for your information, you are not on a potassium restriction currently.  Expected Outcomes:  Demonstrated interest in learning. Expect positive outcomes  Education material provided: Food label handouts, A1C conversion sheet, Meal plan card and Carbohydrate counting sheet, Diabetes Medication handout, Insulin action handout, Potassium content of foods handout  If problems or questions, patient to contact team  via:  Phone  Future DSME appointment: 4-6 wks

## 2019-09-18 ENCOUNTER — Inpatient Hospital Stay: Admission: RE | Admit: 2019-09-18 | Payer: Medicare Other | Source: Ambulatory Visit

## 2019-09-24 ENCOUNTER — Other Ambulatory Visit: Payer: Self-pay

## 2019-09-24 ENCOUNTER — Encounter: Payer: Medicare Other | Attending: Family Medicine | Admitting: *Deleted

## 2019-09-24 DIAGNOSIS — E118 Type 2 diabetes mellitus with unspecified complications: Secondary | ICD-10-CM

## 2019-09-24 DIAGNOSIS — E1122 Type 2 diabetes mellitus with diabetic chronic kidney disease: Secondary | ICD-10-CM | POA: Insufficient documentation

## 2019-09-24 DIAGNOSIS — N183 Chronic kidney disease, stage 3 unspecified: Secondary | ICD-10-CM | POA: Diagnosis not present

## 2019-09-24 DIAGNOSIS — Z713 Dietary counseling and surveillance: Secondary | ICD-10-CM | POA: Diagnosis not present

## 2019-09-25 NOTE — Progress Notes (Signed)
Diabetes Self-Management Education  Visit Type:  Follow-up  Appt. Start Time: 1400 Appt. End Time: 1500  09/25/2019  [name], identified by name and date of birth, is a 78 y.o. female with a diagnosis of Diabetes: Type 2. Patient is here for follow up visit. We did not complete protein guidelines for Chronic Kidney Disease at our last visit. Patient states her A1c is now at 6.9% on September 30th, since our last visit. She feels the previous value was an error.   ASSESSMENT  There were no vitals taken for this visit. There is no height or weight on file to calculate BMI.   Diabetes Self-Management Education - 09/25/19 1539      Psychosocial Assessment   Patient Concerns  Nutrition/Meal planning;Medication;Glycemic Control    Special Needs  None    Preferred Learning Style  Auditory;Visual;Hands on    Learning Readiness  Change in progress      Complications   Last HgB A1C per patient/outside source  6.9 %      Exercise   Exercise Type  ADL's   back pain and shortness of breath, cannot walk very far   How many days per week to you exercise?  0      Individualized Goals (developed by patient)   Nutrition  Follow meal plan discussed   we discussed protein content of each food group today   Physical Activity  Exercise 3-5 times per week   consider chair exercises   Medications  take my medication as prescribed    Monitoring   test blood glucose pre and post meals as discussed      Patient Self-Evaluation of Goals - Patient rates self as meeting previously set goals (% of time)   Nutrition  >75%    Medications  >75%    Monitoring  >75%    Problem Solving  >75%    Reducing Risk  >75%    Health Coping  >75%      Outcomes   Program Status  Not Completed      Subsequent Visit   Since your last visit have you continued or begun to take your medications as prescribed?  Yes    Since your last visit, are you checking your blood glucose at least once a day?  Yes       Learning Objective:  Patient will have a greater understanding of diabetes self-management. Patient education plan is to attend individual and/or group sessions per assessed needs and concerns.  Plan:   Patient Instructions   Fluid: 64 oz per day could be divided into 16 oz per meal = 48 oz with another 16 oz for snacks.  We discussed protein servings in more detail today, I'd suggest about 2 oz for breakfast and about 4 oz for dinner meal.   We worked on some meal [place] with carbohydrate as well as protein content of foods.   We discussed the diabetes medications you are currently on.    I've provided a copy of the Kidney Nutrition Guidelines today.  Expected Outcomes:  Demonstrated interest in learning. Expect positive outcomes  Education material provided: Copy of Choose a Meal from the [place], Chair Exercise handout, Hand written chart of Food Groups with Carbohydrate and Protein Content of Foods.   If problems or questions, patient to contact team via:  Phone  Future DSME appointment: - PRN

## 2019-09-25 NOTE — Patient Instructions (Signed)
   Fluid: 64 oz per day could be divided into 16 oz per meal = 48 oz with another 16 oz for snacks.  We discussed protein servings in more detail today, I'd suggest about 2 oz for breakfast and about 4 oz for dinner meal.   We worked on some meal [place] with carbohydrate as well as protein content of foods.   We discussed the diabetes medications you are currently on.    I've provided a copy of the Kidney Nutrition Guidelines today.

## 2019-10-04 ENCOUNTER — Ambulatory Visit (INDEPENDENT_AMBULATORY_CARE_PROVIDER_SITE_OTHER)
Admission: RE | Admit: 2019-10-04 | Discharge: 2019-10-04 | Disposition: A | Discharge status: Home / Self Care | Payer: Medicare Other | Source: Ambulatory Visit | Attending: Pulmonary Disease | Admitting: Pulmonary Disease

## 2019-10-04 ENCOUNTER — Ambulatory Visit: Payer: Medicare Other

## 2019-10-04 ENCOUNTER — Other Ambulatory Visit: Payer: Self-pay

## 2019-10-04 DIAGNOSIS — R911 Solitary pulmonary nodule: Secondary | ICD-10-CM | POA: Diagnosis not present

## 2019-10-17 ENCOUNTER — Telehealth: Payer: Self-pay | Admitting: Pulmonary Disease

## 2019-10-17 DIAGNOSIS — R918 Other nonspecific abnormal finding of lung field: Secondary | ICD-10-CM

## 2019-10-17 NOTE — Telephone Encounter (Signed)
PCCM:  Called and discussed patient CT scan results.  3 mm nodule stable from last year.  Plan for 1 year follow-up.  At that point would document 2-year stability and no additional imaging needed.  I will place order for repeat CT scan in November 2021.  Please fax a copy of the CT report to patient's primary care provider.  CC: Patient's PCP, Cari Caraway, MD   Garner Nash, DO [facility] 10/17/2019 5:25 PM

## 2019-10-17 NOTE — Telephone Encounter (Signed)
Please advise on Chest CT results.

## 2019-10-17 NOTE — Telephone Encounter (Signed)
CT was faxed via Epic to [facility]

## 2019-10-29 ENCOUNTER — Ambulatory Visit: Payer: Medicare Other | Admitting: Pulmonary Disease

## 2019-11-05 ENCOUNTER — Ambulatory Visit (INDEPENDENT_AMBULATORY_CARE_PROVIDER_SITE_OTHER): Payer: Medicare Other | Admitting: Pulmonary Disease

## 2019-11-05 ENCOUNTER — Other Ambulatory Visit: Payer: Self-pay

## 2019-11-05 DIAGNOSIS — G4733 Obstructive sleep apnea (adult) (pediatric): Secondary | ICD-10-CM | POA: Diagnosis not present

## 2019-11-05 DIAGNOSIS — E669 Obesity, unspecified: Secondary | ICD-10-CM

## 2019-11-05 NOTE — Patient Instructions (Signed)
Will call [facility] to try and track down the overnight oximetry that was performed  If there is significant desaturations, you may need a repeat study to assess whether it still the same and possibility of oxygen supplementation at night  If it is within normal limits, nothing else needs done  Continue weight loss efforts  Encourage adequate number of hours of sleep as you are doing  Call with significant concerns otherwise, we will see you as needed

## 2019-11-05 NOTE — Progress Notes (Signed)
Virtual Visit via Telephone Note  I connected with Claire Blake on 11/05/19 at 12:00 PM EST by telephone and verified that I am speaking with the correct person using two identifiers.  Location: Patient: Mrs. Tyaira Heward Provider: Ander Slade   I discussed the limitations, risks, security and privacy concerns of performing an evaluation and management service by telephone and the availability of in person appointments. I also discussed with the patient that there may be a patient responsible charge related to this service. The patient expressed understanding and agreed to proceed.   History of Present Illness: Patient with mild obstructive sleep apnea Feels well No daytime symptoms  Had sleep study performed in February 2020 that revealed mild obstructive sleep apnea ESS of 1 Good quality sleep, 8 hours at night No daytime sleepiness, no daytime fatigue  Elected not to start CPAP at the time And overnight oximetry was ordered, do not have results of this which we will follow-up on   Observations/Objective: Sounds present on the phone  Assessment and Plan: Mild obstructive sleep apnea with no significant daytime symptoms, reports good quality sleep  Nocturnal desaturations -We will follow up on overnight oximetry  We will make a call to [facility] to try and track down her overnight oximetry  If there is significant desaturations, will need to repeat overnight oximetry to assess for oxygen need If there is no significant desaturations then no other studies need done  I will see her only as needed  Encouraged to call if any significant concerns  Follow Up Instructions:    I discussed the assessment and treatment plan with the patient. The patient was provided an opportunity to ask questions and all were answered. The patient agreed with the plan and demonstrated an understanding of the instructions.   The patient was advised to call back or seek an in-person  evaluation if the symptoms worsen or if the condition fails to improve as anticipated.  I provided 15 minutes of non-face-to-face time during this encounter.   Laurin Coder, MD

## 2019-11-06 ENCOUNTER — Telehealth: Payer: Self-pay | Admitting: Pulmonary Disease

## 2019-11-06 DIAGNOSIS — G4734 Idiopathic sleep related nonobstructive alveolar hypoventilation: Secondary | ICD-10-CM

## 2019-11-06 NOTE — Telephone Encounter (Signed)
Called and spoke with Patient.  Dr.Olalere's recommendations given.  Patient stated understanding. ONO ordered.  Nothing further at this time.

## 2019-11-06 NOTE — Telephone Encounter (Signed)
See other note for results.   

## 2019-11-06 NOTE — Telephone Encounter (Signed)
ONO results received via fax and given to Dr. Ander Slade. Will follow up.

## 2019-11-06 NOTE — Telephone Encounter (Signed)
Overnight oximetry reviewed showing significant desaturations  I believe we should repeat the oximetry to see whether you require oxygen supplementation-unfortunately we cannot use a study that was done in February to prescribe oxygen Based on the previous one, you do require oxygen  Please call patient to advise her

## 2019-11-09 IMAGING — MR MR LUMBAR SPINE W/O CM
4 of 5 series · 25 of 48 positions shown · non-contrast
Comparison: No prior lumbar imaging. Intraoperative cholangiogram
01/16/2014.

CLINICAL DATA: 76-year-old female with prior lumbar surgery in
4224. Lumbar back pain, exacerbated by long periods of standing.

EXAM:
MRI LUMBAR SPINE WITHOUT CONTRAST
TECHNIQUE: Multiplanar, multisequence MR imaging of the lumbar spine was
performed. No intravenous contrast was administered.

[Series 4: T1 · sagittal · 4.0mm · 0.55mm/px · 5 of 13 slices shown (1 of 2)]
[im 1/13]
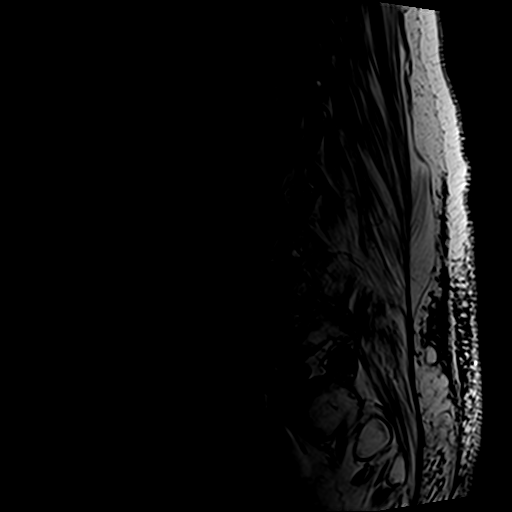
[im 4/13]
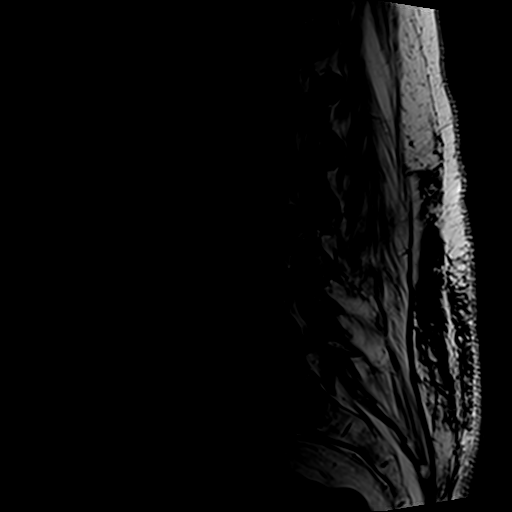
[im 7/13]
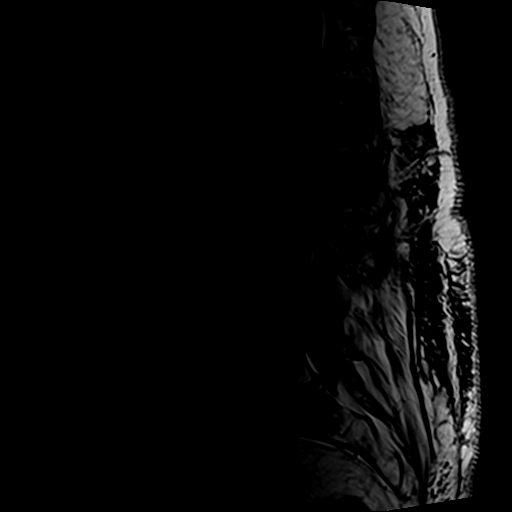
[im 10/13]
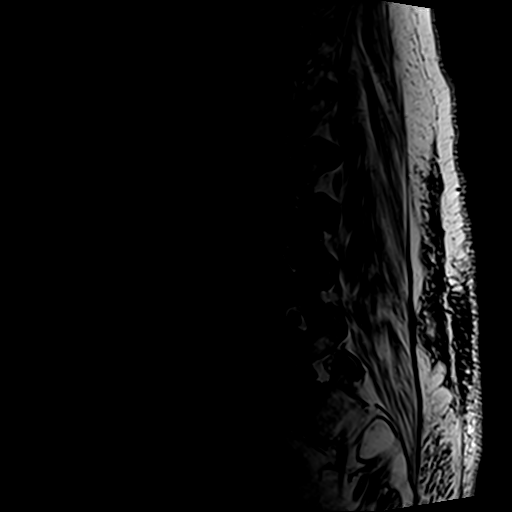
[im 13/13]
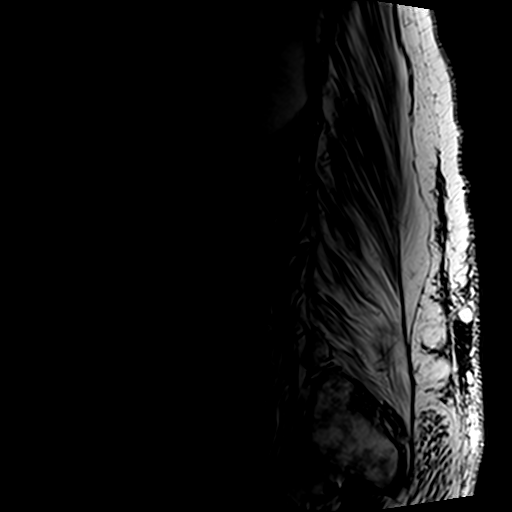

[Series 5: T2 · sagittal · 4.0mm · 0.55mm/px · 5 of 13 slices shown (1 of 2)]
[im 1/13]
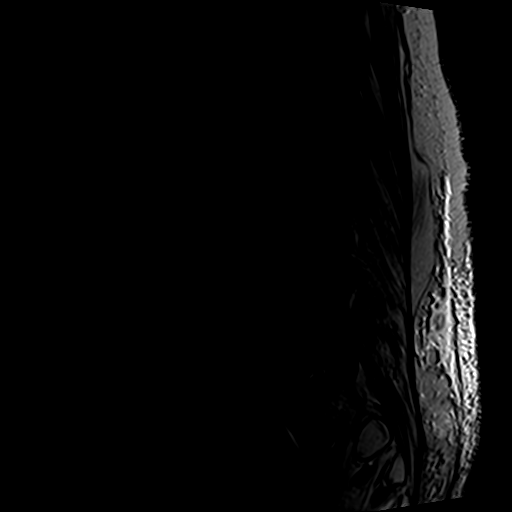
[im 4/13]
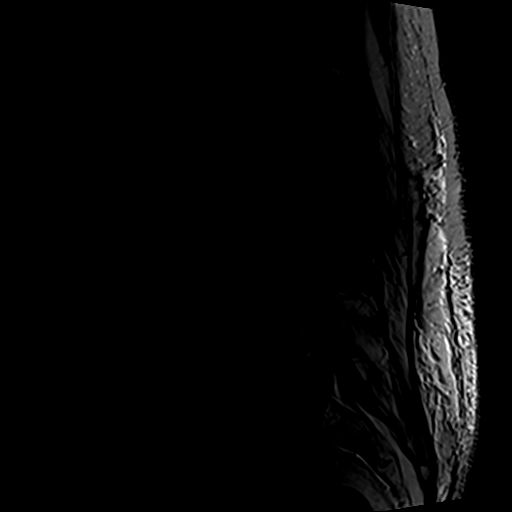
[im 7/13]
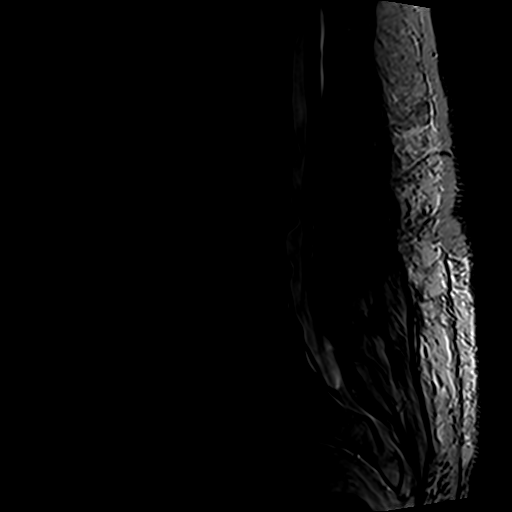
[im 10/13]
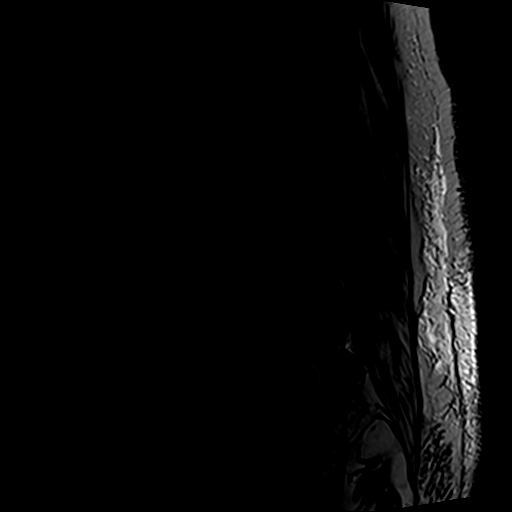
[im 13/13]
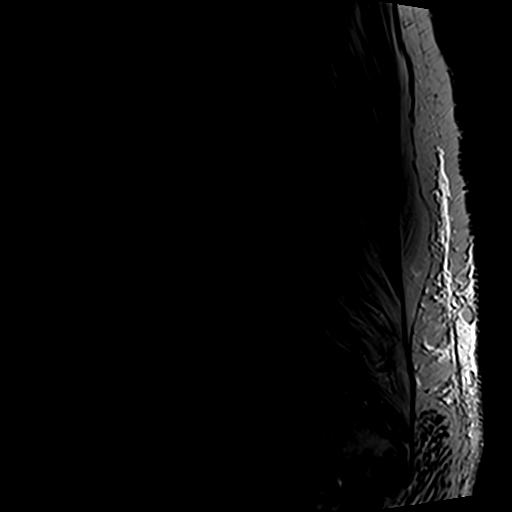

[Series 8: T2 · axial · 4.0mm · 0.74mm/px · z∈[-42,+154]mm · 10 of 39 slices shown (2 of 2)]
[im 3/39]
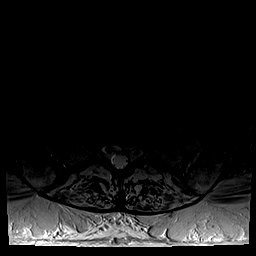
[im 6/39]
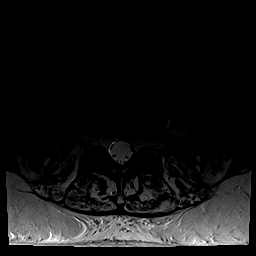
[im 8/39]
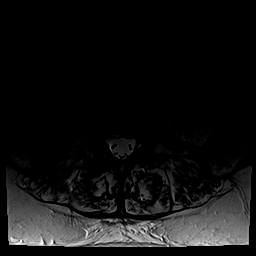
[im 13/39]
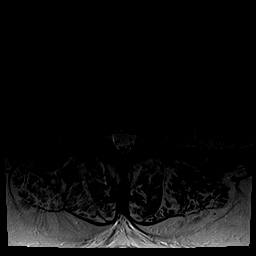
[im 18/39]
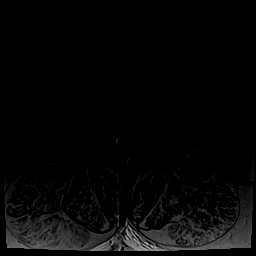
[im 21/39]
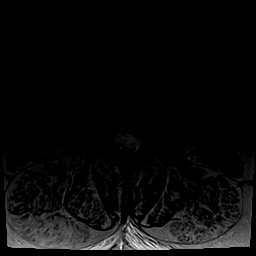
[im 23/39]
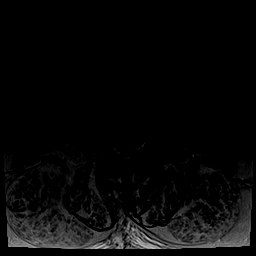
[im 28/39]
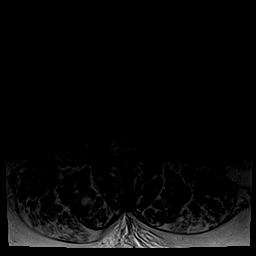
[im 33/39]
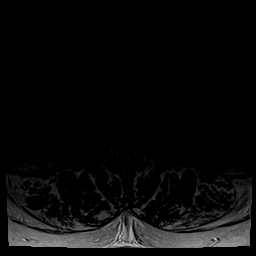
[im 39/39]
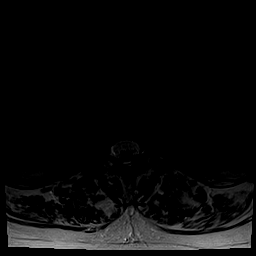

[Series 11: T1 · axial · 4.0mm · 0.37mm/px · z∈[-42,+123]mm · 5 of 39 slices shown (2 of 2)]
[im 3/39]
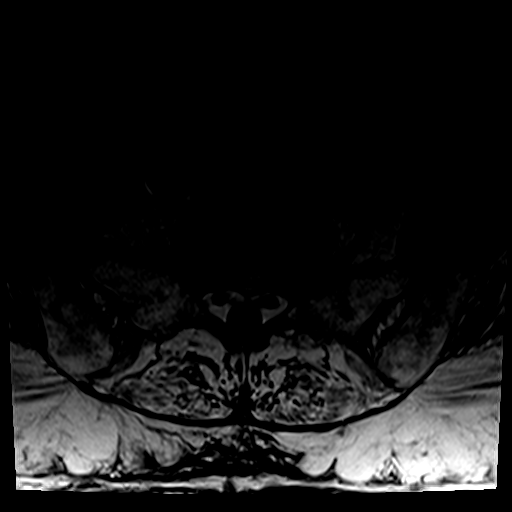
[im 6/39]
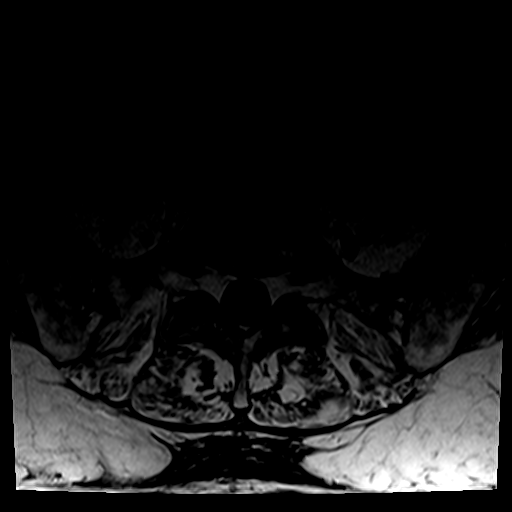
[im 8/39]
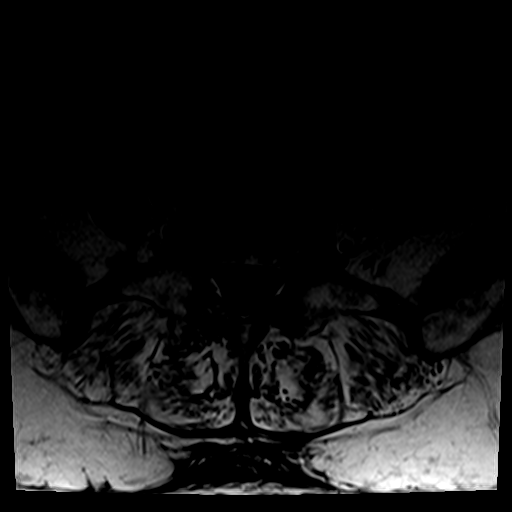
[im 21/39]
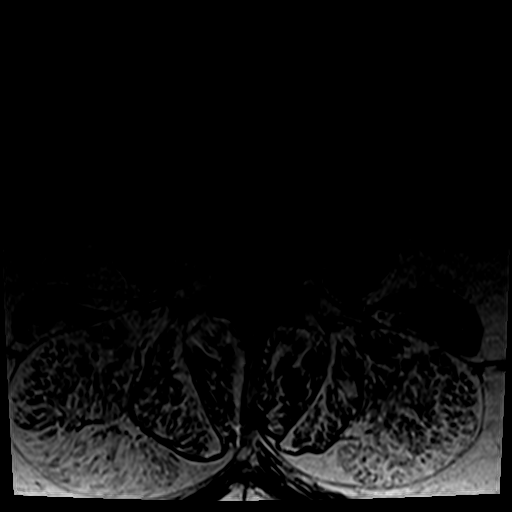
[im 33/39]
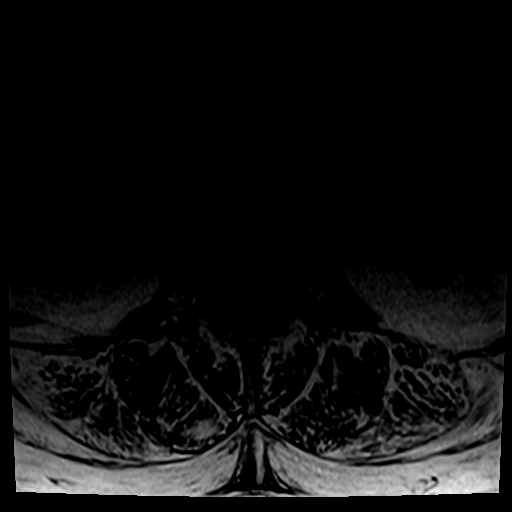

[25 of 48 positions shown; findings below may reference images not displayed]

FINDINGS: Segmentation: Lumbar segmentation appears to be normal and will be
designated as such for this report.

Alignment: Mild levoconvex lower lumbar scoliosis. Mild
straightening of lumbar lordosis. Subtle retrolisthesis of L2 on L3.

Vertebrae: Normal background bone marrow signal. Mild acute
degenerative appearing marrow edema at the right lateral inferior
endplate of L2, superimposed on chronic degenerative endplate marrow
signal changes at L2-L3. Also superimposed chronic L2 superior
endplate prominent Schmorl's node or mild compression fracture.
Chronic degenerative endplate marrow signal changes elsewhere in the
lower lumbar spine. Incidental benign vertebral body hemangioma in
the right S1 vertebra.

Conus medullaris and cauda equina: Conus extends to the T12-L1
level. Conus and cauda equina appear normal.

Paraspinal and other soft tissues: Bilateral lower thoracic and
lumbar paraspinal muscle atrophy. Negative visible abdominal
viscera.

Disc levels:

T11-T12: Negative.

T12-L1:  Negative.

L1-L2: Disc desiccation with mild broad-based posterior disc
bulging. Mild facet hypertrophy. Mild left L1 neural foraminal
stenosis.

L2-L3: Severe disc space loss. Circumferential disc osteophyte
complex with broad-based posterior and bilateral foraminal
involvement. Mild to moderate facet and ligament flavum hypertrophy
greater on the left. Mild spinal stenosis. Mild to moderate left and
mild right L2 neural foraminal stenosis. No convincing lateral
recess stenosis.

L3-L4: Disc space loss. Circumferential disc bulge with small
superimposed central disc protrusion (series 8, image 23). Moderate
facet and ligament flavum hypertrophy. No significant spinal or
lateral recess stenosis. Mild right greater than left L3 neural
foraminal stenosis.

L4-L5: Mild circumferential disc bulge. Moderate to severe facet
hypertrophy greater on the right. No spinal or lateral recess
stenosis. No significant foraminal stenosis.

L5-S1: Severe disc space loss. Mild mostly far lateral disc bulging
and endplate spurring. Mild facet hypertrophy. No stenosis.
IMPRESSION: 1.  No acute osseous abnormality in the lumbar spine.
2. Diffuse lumbar disc degeneration, severe at L2-L3. Mild
multifactorial spinal stenosis at that level with mild right and
moderate left L2 neural foraminal stenosis.
3. No other lumbar spinal stenosis or convincing neural impingement.

## 2019-11-18 ENCOUNTER — Ambulatory Visit: Payer: Medicare Other | Admitting: Internal Medicine

## 2019-11-20 ENCOUNTER — Encounter: Payer: Self-pay | Admitting: Pulmonary Disease

## 2019-11-21 ENCOUNTER — Telehealth: Payer: Self-pay | Admitting: Pulmonary Disease

## 2019-11-21 DIAGNOSIS — G4734 Idiopathic sleep related nonobstructive alveolar hypoventilation: Secondary | ICD-10-CM

## 2019-11-21 NOTE — Telephone Encounter (Signed)
Called and spoke with Patient. Patient requested a copy of her ONO.  Patient confirmed mailing address. Copy of ONO placed in sealed envelope and placed in out going mail.  Nothing further at this time.

## 2019-11-21 NOTE — Telephone Encounter (Signed)
Inform patient Overnight oximetry reviewed Overnight oximetry does reveal significant desaturations  3 L of oxygen at night should be initiated  Referral to DME company for 3 L of oxygen

## 2019-11-21 NOTE — Telephone Encounter (Signed)
Called and spoke with Patient.  Dr. Olalere's results and recommendations given.  Understanding stated.  DME order placed.  Nothing further at this time. 

## 2019-11-22 ENCOUNTER — Telehealth: Payer: Self-pay | Admitting: Pulmonary Disease

## 2019-11-22 NOTE — Telephone Encounter (Signed)
Spoke with pt, she is wanting [place] to explain to her what all gois into wearing oxygen at night. I explained to her process and what would happen. I advised her to call back if she had any other questions. Pt understood and nothing further is needed.

## 2019-11-22 NOTE — Telephone Encounter (Signed)
Pt has more questions that she needs answers to. Please call at [phone]

## 2019-11-25 ENCOUNTER — Telehealth: Payer: Self-pay | Admitting: Pulmonary Disease

## 2019-11-25 DIAGNOSIS — G4733 Obstructive sleep apnea (adult) (pediatric): Secondary | ICD-10-CM

## 2019-11-25 DIAGNOSIS — E669 Obesity, unspecified: Secondary | ICD-10-CM

## 2019-11-25 NOTE — Telephone Encounter (Signed)
Catron, Kennon Portela, [place] D; Catron, Stanford Breed, [place]; Marlane Mingle   This patient's notes show her primary diagnosis is OSA, and the order is for nocturnal o2 only, so she would have to have a titrated sleep study in order to qualify for nocturnal o2.   We can't use the ono.   Can't leave triage message because it is after hours.

## 2019-11-25 NOTE — Telephone Encounter (Signed)
Had to leave vm for sleep lab to call me to schedule.

## 2019-11-25 NOTE — Telephone Encounter (Signed)
Called pt to see which location she wants to go to and she wasn't aware she had to get study done.  Transferred her to [place] so she can explain to her and pt told me she wants [place].  Will call & get test scheduled.

## 2019-11-25 NOTE — Telephone Encounter (Signed)
Corrected order placed. If anything else is needed, please let me know.  Message sent to [facility] pool due to holiday week (coverage)

## 2019-12-02 ENCOUNTER — Encounter: Payer: Self-pay | Admitting: Internal Medicine

## 2019-12-02 ENCOUNTER — Telehealth (INDEPENDENT_AMBULATORY_CARE_PROVIDER_SITE_OTHER): Payer: Medicare Other | Admitting: Internal Medicine

## 2019-12-02 VITALS — BP 128/66 | Ht 67.5 in | Wt 251.2 lb

## 2019-12-02 DIAGNOSIS — I2584 Coronary atherosclerosis due to calcified coronary lesion: Secondary | ICD-10-CM

## 2019-12-02 DIAGNOSIS — R0602 Shortness of breath: Secondary | ICD-10-CM

## 2019-12-02 DIAGNOSIS — I2510 Atherosclerotic heart disease of native coronary artery without angina pectoris: Secondary | ICD-10-CM

## 2019-12-02 NOTE — Progress Notes (Signed)
Virtual Visit via Telephone Note   This visit type was conducted due to national recommendations for restrictions regarding the COVID-19 Pandemic (e.g. social distancing) in an effort to limit this patient's exposure and mitigate transmission in our community.  Due to her co-morbid illnesses, this patient is at least at moderate risk for complications without adequate follow up.  This format is felt to be most appropriate for this patient at this time.  The patient did not have access to video technology/had technical difficulties with video requiring transitioning to audio format only (telephone).  All issues noted in this document were discussed and addressed.  No physical exam could be performed with this format.  Please refer to the patient's chart for her  consent to telehealth for [facility].   Evaluation Performed:  Telephone follow-up  Date:  12/02/2019   ID:  Claire Blake, DOB 05-Dec-1941, MRN [id number]  Patient Location:  [place] [address]  Provider location:   [address], [place], [place] [address]  PCP:  Cari Caraway, MD  Cardiologist:  Pixie Casino, MD Electrophysiologist:  None   Chief Complaint:  Shortness of breath  History of Present Illness:    Claire Blake is a 78 y.o. female who presents via audio/video conferencing for a telehealth visit today.  Claire Blake was seen today via telehealth visit for follow-up of shortness of breath.  I saw her earlier this year for multivessel coronary calcification.  She has had progressive shortness of breath and was referred by pulmonary as she was noted to have multivessel coronary calcium on CT imaging.  Follow-up CT scan showed some granulomatous disease and a stable pulmonary nodule.  She was found to have some mild obstructive sleep apnea and nocturnal hypoxemia.  After some difficulty was determined she needed to be placed on nocturnal oxygen which is still pending.  She does report  shortness of breath again with exertion that is relieved by rest.  Echo was fairly reassuring with normal systolic function and mild diastolic dysfunction.  I had proposed stress testing but due to illness this was canceled and then ultimately had recommended right and left heart catheterization which was also canceled just prior to the start of the COVID-19 pandemic.  The patient does not have symptoms concerning for COVID-19 infection (fever, chills, cough, or new SHORTNESS OF BREATH).    Prior CV studies:   The following studies were reviewed today:  Chart reviewed  PMHx:  Past Medical History:  Diagnosis Date  . Anemia    hx  . Bronchitis    hx  . CKD (chronic kidney disease), stage III   . Diabetes mellitus without complication ([place])   . Glaucoma   . Hyperlipidemia   . Hypertension   . Hypothyroidism   . Obesity   . Pneumonia    hx  . Rheumatic fever    78 yrs old  . Thyroid disease     Past Surgical History:  Procedure Laterality Date  . ABDOMINAL HYSTERECTOMY     partial  . ANKLE FRACTURE SURGERY    . BACK SURGERY  2006   ruptured disc  . CHOLECYSTECTOMY N/A 01/16/2014   Procedure: LAPAROSCOPIC CHOLECYSTECTOMY WITH INTRAOPERATIVE CHOLANGIOGRAM;  Surgeon: Imogene Burn. Georgette Dover, MD;  Location: [facility];  Service: General;  Laterality: N/A;  . ESOPHAGOGASTRODUODENOSCOPY N/A 01/16/2014   Procedure: ESOPHAGOGASTRODUODENOSCOPY (EGD);  Surgeon: Cleotis Nipper, MD;  Location: [facility] ENDOSCOPY;  Service: Endoscopy;  Laterality: N/A;  FAMHx:  Family History  Problem Relation Age of Onset  . COPD Mother   . Cancer Father        throat  . Cancer Maternal Grandmother        bilateral breast  . Breast cancer Maternal Grandmother 67    SOCHx:   reports that she has never smoked. She has never used smokeless tobacco. She reports that she does not drink alcohol or use drugs.  ALLERGIES:  Allergies  Allergen Reactions  . Zocor [Simvastatin]     Leg cramps     MEDS:  Current Meds  Medication Sig  . amLODipine (NORVASC) 2.5 MG tablet Take 2.5 mg by mouth every evening.   Marland Kitchen aspirin EC 81 MG tablet Take 81 mg by mouth every evening.   . Blood Glucose Monitoring Suppl (ONE TOUCH ULTRA SYSTEM KIT) W/DEVICE KIT 1 kit by Does not apply route once.  . Calcium Carbonate-Vitamin D (CALCIUM-D PO) Take 1 tablet by mouth daily.  Marland Kitchen glimepiride (AMARYL) 2 MG tablet Take 2 mg by mouth 2 (two) times daily.   Marland Kitchen latanoprost (XALATAN) 0.005 % ophthalmic solution Place 1 drop into both eyes at bedtime.  Marland Kitchen levothyroxine (SYNTHROID, LEVOTHROID) 88 MCG tablet Take 44-88 mcg by mouth See admin instructions. Take 0.5 tablet (44 mcg) by mouth on Saturdays, then take 1 tablet (88 mcg) by mouth on all other days in the morning before breakfast.  . lisinopril (PRINIVIL,ZESTRIL) 40 MG tablet Take 40 mg by mouth every evening.   . metFORMIN (GLUCOPHAGE-XR) 500 MG 24 hr tablet Take 500 mg by mouth 2 (two) times daily.  . pioglitazone (ACTOS) 15 MG tablet Take 15 mg by mouth daily.   . rosuvastatin (CRESTOR) 20 MG tablet Take 20 mg by mouth every evening.      ROS: Pertinent items noted in HPI and remainder of comprehensive ROS otherwise negative.  Labs/Other Tests and Data Reviewed:    Recent Labs: No results found for requested labs within last 8760 hours.   Recent Lipid Panel No results found for: CHOL, TRIG, HDL, CHOLHDL, LDLCALC, LDLDIRECT  Wt Readings from Last 3 Encounters:  12/02/19 251 lb 3.2 oz (113.9 kg)  12/24/18 263 lb (119.3 kg)  12/10/18 257 lb (116.6 kg)     Exam:    Vital Signs:  BP 128/66   Ht 5' 7.5" (1.715 m)   Wt 251 lb 3.2 oz (113.9 kg)   BMI 38.76 kg/m    Exam not performed due to telephone visit  ASSESSMENT & PLAN:    1. Persistent dyspnea on exertion 2. Mild obstructive sleep apnea with nocturnal hypoxemia (not on CPAP) 3. Multivessel coronary calcification  Claire. Mckinzie is still concerned with progressive dyspnea on exertion.   I do not think this is adequately explained by mild sleep apnea or her nocturnal hypoxemia.  There may be underlying obstructive coronary disease.  I would recommend a Lexiscan Myoview to evaluate for any reversible ischemia.  Ultimately if that were abnormal she may need cardiac catheterization.  She has no chest pain symptoms.  COVID-19 Education: The signs and symptoms of COVID-19 were discussed with the patient and how to seek care for testing (follow up with PCP or arrange E-visit).  The importance of social distancing was discussed today.  Patient Risk:   After full review of this patients clinical status, I feel that they are at least moderate risk at this time.  Time:   Today, I have spent 25 minutes with the patient  with telehealth technology discussing dyspnea on exertion, multivessel coronary calcium, sleep study.     Medication Adjustments/Labs and Tests Ordered: Current medicines are reviewed at length with the patient today.  Concerns regarding medicines are outlined above.   Tests Ordered: Orders Placed This Encounter  Procedures  . Myocardial Perfusion Imaging    Medication Changes: No orders of the defined types were placed in this encounter.   Disposition:  in 1 month(s)  Pixie Casino, MD, [facility], [place] Director of the Advanced Lipid Disorders &  Cardiovascular Risk Reduction Clinic Diplomate of the American Board of Clinical Lipidology Attending Cardiologist  Direct Dial: [phone]  Fax: [phone]  Website:  [email or web address]  Pixie Casino, MD  12/02/2019 1:17 PM

## 2019-12-02 NOTE — Patient Instructions (Addendum)
Medication Instructions:  NO CHANGES *If you need a refill on your cardiac medications before your next appointment, please call your pharmacy*  Lab Work: NONE NEEDED If you have labs (blood work) drawn today and your tests are completely normal, you will receive your results only by: Marland Kitchen MyChart Message (if you have MyChart) OR . A paper copy in the mail If you have any lab test that is abnormal or we need to change your treatment, we will call you to review the results.  Testing/Procedures: Your physician has requested that you have a lexiscan myoview. For further information please visit [email or web address]. Please follow instruction sheet, as given.  [address]  [address]  Follow-Up: At [facility], you and your health needs are our priority.  As part of our continuing mission to provide you with exceptional heart care, we have created designated Provider Care Teams.  These Care Teams include your primary Cardiologist (physician) and Advanced Practice Providers (APPs -  Physician Assistants and Nurse Practitioners) who all work together to provide you with the care you need, when you need it.  Your next appointment:   F/U with Dr. Debara Pickett after the [place].

## 2019-12-12 ENCOUNTER — Telehealth (HOSPITAL_COMMUNITY): Payer: Self-pay

## 2019-12-12 NOTE — Telephone Encounter (Signed)
Encounter complete. 

## 2019-12-16 ENCOUNTER — Other Ambulatory Visit (HOSPITAL_COMMUNITY): Payer: Medicare Other

## 2019-12-18 ENCOUNTER — Inpatient Hospital Stay (HOSPITAL_COMMUNITY): Admission: RE | Admit: 2019-12-18 | Payer: Medicare Other | Source: Ambulatory Visit

## 2019-12-18 ENCOUNTER — Encounter (HOSPITAL_BASED_OUTPATIENT_CLINIC_OR_DEPARTMENT_OTHER): Payer: Medicare Other | Admitting: Pulmonary Disease

## 2019-12-19 ENCOUNTER — Ambulatory Visit (HOSPITAL_COMMUNITY): Payer: Medicare Other

## 2019-12-30 ENCOUNTER — Other Ambulatory Visit: Payer: Self-pay | Admitting: Pulmonary Disease

## 2019-12-30 DIAGNOSIS — G4734 Idiopathic sleep related nonobstructive alveolar hypoventilation: Secondary | ICD-10-CM

## 2020-01-03 ENCOUNTER — Ambulatory Visit: Payer: Medicare Other | Admitting: Internal Medicine

## 2020-07-13 ENCOUNTER — Other Ambulatory Visit (HOSPITAL_BASED_OUTPATIENT_CLINIC_OR_DEPARTMENT_OTHER): Payer: Self-pay

## 2020-07-13 DIAGNOSIS — R0683 Snoring: Secondary | ICD-10-CM

## 2020-07-13 DIAGNOSIS — R5383 Other fatigue: Secondary | ICD-10-CM

## 2020-08-27 ENCOUNTER — Other Ambulatory Visit: Payer: Self-pay

## 2020-08-27 ENCOUNTER — Ambulatory Visit (HOSPITAL_BASED_OUTPATIENT_CLINIC_OR_DEPARTMENT_OTHER): Payer: Medicare Other | Attending: Internal Medicine | Admitting: Internal Medicine

## 2020-08-27 DIAGNOSIS — R0683 Snoring: Secondary | ICD-10-CM

## 2020-08-27 DIAGNOSIS — R5383 Other fatigue: Secondary | ICD-10-CM

## 2020-10-06 ENCOUNTER — Other Ambulatory Visit: Payer: Medicare Other

## 2021-01-07 DIAGNOSIS — H903 Sensorineural hearing loss, bilateral: Secondary | ICD-10-CM | POA: Diagnosis not present

## 2021-01-29 DIAGNOSIS — N1832 Chronic kidney disease, stage 3b: Secondary | ICD-10-CM | POA: Diagnosis not present

## 2021-01-29 DIAGNOSIS — E039 Hypothyroidism, unspecified: Secondary | ICD-10-CM | POA: Diagnosis not present

## 2021-01-29 DIAGNOSIS — I2510 Atherosclerotic heart disease of native coronary artery without angina pectoris: Secondary | ICD-10-CM | POA: Diagnosis not present

## 2021-01-29 DIAGNOSIS — H35033 Hypertensive retinopathy, bilateral: Secondary | ICD-10-CM | POA: Diagnosis not present

## 2021-01-29 DIAGNOSIS — H409 Unspecified glaucoma: Secondary | ICD-10-CM | POA: Diagnosis not present

## 2021-01-29 DIAGNOSIS — E1122 Type 2 diabetes mellitus with diabetic chronic kidney disease: Secondary | ICD-10-CM | POA: Diagnosis not present

## 2021-01-29 DIAGNOSIS — I10 Essential (primary) hypertension: Secondary | ICD-10-CM | POA: Diagnosis not present

## 2021-01-29 DIAGNOSIS — E782 Mixed hyperlipidemia: Secondary | ICD-10-CM | POA: Diagnosis not present

## 2021-02-02 ENCOUNTER — Inpatient Hospital Stay (HOSPITAL_COMMUNITY): Admission: RE | Admit: 2021-02-02 | Payer: Medicare Other | Source: Ambulatory Visit

## 2021-02-03 ENCOUNTER — Ambulatory Visit (HOSPITAL_COMMUNITY): Payer: Medicare Other

## 2021-02-16 ENCOUNTER — Other Ambulatory Visit: Payer: Self-pay

## 2021-02-16 ENCOUNTER — Ambulatory Visit: Payer: Medicare Other | Admitting: Internal Medicine

## 2021-02-16 ENCOUNTER — Encounter: Payer: Self-pay | Admitting: Internal Medicine

## 2021-02-16 VITALS — BP 140/60 | HR 75 | Ht 67.5 in | Wt 223.0 lb

## 2021-02-16 DIAGNOSIS — E668 Other obesity: Secondary | ICD-10-CM

## 2021-02-16 DIAGNOSIS — E782 Mixed hyperlipidemia: Secondary | ICD-10-CM | POA: Diagnosis not present

## 2021-02-16 DIAGNOSIS — R0602 Shortness of breath: Secondary | ICD-10-CM | POA: Diagnosis not present

## 2021-02-16 DIAGNOSIS — I10 Essential (primary) hypertension: Secondary | ICD-10-CM

## 2021-02-16 DIAGNOSIS — I2584 Coronary atherosclerosis due to calcified coronary lesion: Secondary | ICD-10-CM | POA: Diagnosis not present

## 2021-02-16 DIAGNOSIS — I2510 Atherosclerotic heart disease of native coronary artery without angina pectoris: Secondary | ICD-10-CM | POA: Diagnosis not present

## 2021-02-16 NOTE — Patient Instructions (Signed)

## 2021-02-16 NOTE — Progress Notes (Signed)
OFFICE CONSULT NOTE  Chief Complaint:  Follow-up stress test  Primary Care Physician: Cari Caraway, MD  HPI:  Claire Blake is a 80 y.o. female who is being seen today for the evaluation of shortness of breath at the request of Cari Caraway, MD.  This is a pleasant 80 year old female who is originally from the [place] area.  She presents with several weeks of progressive shortness of breath.  She has a history of episodes of bronchitis but no known COPD.  Her mother had COPD and died from this in her late 39s however she reports not being a smoker.  Recently she underwent work-up by her PCP including a d-dimer which was abnormal.  This led to lower extremity arterial Dopplers which were negative for DVT and a CT pulmonary angiogram which demonstrated no PE, however she was noted to have age advanced coronary atherosclerosis and borderline cardiomegaly without pericardial effusion or pleural effusions.  An EKG was performed in our office which indicated possible inferior infarct pattern which I personally reviewed and therefore she was referred for cardiovascular evaluation.  Ms. Mcgowen denies any chest pain, but reports exertional dyspnea.  She says she sleeps upright in a recliner but has for years.  She denies orthopnea, PND or other heart failure symptoms but has had lower extremity edema and recent weight gain.  09/26/2018  Mrs. Laye returns today for follow-up of her shortness of breath.  She underwent an echocardiogram which showed normal systolic function mild diastolic dysfunction.  BNP was low and metabolic profile was fairly normal.  Weight is up a few more pounds since she was last seen however blood pressure is well controlled today.  She reports some improvement in her dizziness and no worsening of her shortness of breath.  She denies any chest pain.  Symptoms could be coronary ischemia however she has had no typical symptoms of that.  12/24/2018  Mrs. Amendola is seen  today in follow-up.  She was supposed to follow-up on nuclear stress testing given no significant improvement in her shortness of breath.  She had been referred to pulmonary and saw Dr. Valeta Harms.  He performed CT scan of the chest which did show a nodule and there was pulmonary artery enlargement concerning for pulmonary hypertension but no other significant parenchymal disease.  He message me about these findings and we discussed the possibility of work-up for pulmonary hypertension.  On her echo unfortunately she did not have any significant tricuspid regurgitation to estimate pulmonary pressure.  She was supposed to have a stress test but had to cancel that due to bouts of diverticulitis and bronchitis in December.  She says she is now recovering from that.  Based on the need for invasive work-up to assess for pulmonary hypertension however I am now recommending left and right heart catheterization.  02/16/2021  Mrs. Vollman is seen today in follow-up.  Its been 2 years since I last saw her.  We discussed both heart catheterization as well as stress testing.  Unfortunately she did not follow-up with that.  She was recently referred back by Dr. Leonides Schanz.  She actually says she has had marked improvement in her shortness of breath.  She is lost about 40 pounds and feels like she is breathing a lot better.  This would be unusual for coronary artery disease and suggest that the weight was probably playing a big role in her symptoms.  In addition, labs indicated recently stage IIIb chronic kidney disease.  Given that,  she would be a marginal candidate for catheterization if she had abnormal testing anyhow.  PMHx:  Past Medical History:  Diagnosis Date  . Anemia    hx  . Bronchitis    hx  . CKD (chronic kidney disease), stage III ([place])   . Diabetes mellitus without complication ([place])   . Glaucoma   . Hyperlipidemia   . Hypertension   . Hypothyroidism   . Obesity   . Pneumonia    hx  . Rheumatic fever     80 yrs old  . Thyroid disease     Past Surgical History:  Procedure Laterality Date  . ABDOMINAL HYSTERECTOMY     partial  . ANKLE FRACTURE SURGERY    . BACK SURGERY  2006   ruptured disc  . CHOLECYSTECTOMY N/A 01/16/2014   Procedure: LAPAROSCOPIC CHOLECYSTECTOMY WITH INTRAOPERATIVE CHOLANGIOGRAM;  Surgeon: Imogene Burn. Georgette Dover, MD;  Location: [facility];  Service: General;  Laterality: N/A;  . ESOPHAGOGASTRODUODENOSCOPY N/A 01/16/2014   Procedure: ESOPHAGOGASTRODUODENOSCOPY (EGD);  Surgeon: Cleotis Nipper, MD;  Location[facility] ENDOSCOPY;  Service: Endoscopy;  Laterality: N/A;    FAMHx:  Family History  Problem Relation Age of Onset  . COPD Mother   . Cancer Father        throat  . Cancer Maternal Grandmother        bilateral breast  . Breast cancer Maternal Grandmother 24    SOCHx:   reports that she has never smoked. She has never used smokeless tobacco. She reports that she does not drink alcohol and does not use drugs.  ALLERGIES:  Allergies  Allergen Reactions  . Zocor [Simvastatin]     Leg cramps    ROS: Pertinent items noted in HPI and remainder of comprehensive ROS otherwise negative.  HOME MEDS: Current Outpatient Medications on File Prior to Visit  Medication Sig Dispense Refill  . amLODipine (NORVASC) 5 MG tablet Take 5 mg by mouth daily.    Marland Kitchen aspirin EC 81 MG tablet Take 81 mg by mouth every evening.     . Blood Glucose Monitoring Suppl (ONE TOUCH ULTRA SYSTEM KIT) W/DEVICE KIT 1 kit by Does not apply route once.    . calcium carbonate (CALCIUM 600) 600 MG TABS tablet Take 600 mg by mouth daily.    . Cholecalciferol (VITAMIN D3) 50 MCG (2000 UT) TABS Take 1 tablet by mouth daily.    . Empagliflozin-metFORMIN HCl ER (SYNJARDY XR) 25-1000 MG TB24 Take 1 tablet by mouth in the morning.    Marland Kitchen glipiZIDE (GLUCOTROL) 10 MG tablet Take 10 mg by mouth 2 (two) times daily before a meal.    . latanoprost (XALATAN) 0.005 % ophthalmic solution Place 1 drop into both eyes at  bedtime.    Marland Kitchen levothyroxine (SYNTHROID, LEVOTHROID) 88 MCG tablet Take 44-88 mcg by mouth See admin instructions. Take 0.5 tablet (44 mcg) by mouth on Saturdays, then take 1 tablet (88 mcg) by mouth on all other days in the morning before breakfast.    . lisinopril (PRINIVIL,ZESTRIL) 40 MG tablet Take 40 mg by mouth every evening.     . rosuvastatin (CRESTOR) 20 MG tablet Take 20 mg by mouth every evening.      No current facility-administered medications on file prior to visit.    LABS/IMAGING: No results found for this or any previous visit (from the past 48 hour(s)). No results found.  LIPID PANEL: No results found for: CHOL, TRIG, HDL, CHOLHDL, VLDL, LDLCALC, LDLDIRECT  WEIGHTS: Wt Readings from  Last 3 Encounters:  02/16/21 223 lb (101.2 kg)  12/02/19 251 lb 3.2 oz (113.9 kg)  12/24/18 263 lb (119.3 kg)    VITALS: BP 140/60   Pulse 75   Ht 5' 7.5" (1.715 m)   Wt 223 lb (101.2 kg)   BMI 34.41 kg/m   EXAM: General appearance: alert, no distress and morbidly obese Lungs: clear to auscultation bilaterally Heart: regular rate and rhythm, S1, S2 normal, no murmur, click, rub or gallop Extremities: extremities normal, atraumatic, no cyanosis or edema Neurologic: Grossly normal  EKG: Sinus rhythm with PACs, rightward axis at 75-personally reviewed  ASSESSMENT: 1. Dyspnea on exertion -will LVEF 55 to 28%, grade 1 diastolic dysfunction (83/3744) 2. Pulmonary artery enlargement on CT 3. Morbid obesity 4. Multivessel coronary artery calcification 5. Type 2 diabetes 6. Hypertension 7. Hyperlipidemia 8. CKD 3  PLAN: 1.   Mrs. Shumard has had improvement in her dyspnea on exertion over the past year and a half since I last saw her but never underwent definitive testing.  Since her symptoms are improving it would be unlikely that this was due to be due to coronary disease.  I recommend we continue to monitor this and if she becomes symptomatic we could consider stress testing.   I am less inclined to do a catheterization because of her chronic kidney disease.  She does have multivessel coronary calcification and should have good control of her diabetes, hypertension and dyslipidemia to target LDL less than 70.  I am happy to see her back on an as-needed basis.  Thanks again for allowing to participate her care.  Pixie Casino, MD, [facility], [place] Director of the Advanced Lipid Disorders &  Cardiovascular Risk Reduction Clinic Diplomate of the American Board of Clinical Lipidology Attending Cardiologist  Direct Dial: [phone]  Fax: [phone]  Website:  [email or web address] 02/16/2021, 2:50 PM

## 2021-03-02 DIAGNOSIS — H401131 Primary open-angle glaucoma, bilateral, mild stage: Secondary | ICD-10-CM | POA: Diagnosis not present

## 2021-05-10 DIAGNOSIS — Z23 Encounter for immunization: Secondary | ICD-10-CM | POA: Diagnosis not present

## 2021-05-10 DIAGNOSIS — M85852 Other specified disorders of bone density and structure, left thigh: Secondary | ICD-10-CM | POA: Diagnosis not present

## 2021-05-10 DIAGNOSIS — E538 Deficiency of other specified B group vitamins: Secondary | ICD-10-CM | POA: Diagnosis not present

## 2021-05-10 DIAGNOSIS — Z7984 Long term (current) use of oral hypoglycemic drugs: Secondary | ICD-10-CM | POA: Diagnosis not present

## 2021-05-10 DIAGNOSIS — H5789 Other specified disorders of eye and adnexa: Secondary | ICD-10-CM | POA: Diagnosis not present

## 2021-05-10 DIAGNOSIS — E1122 Type 2 diabetes mellitus with diabetic chronic kidney disease: Secondary | ICD-10-CM | POA: Diagnosis not present

## 2021-05-10 DIAGNOSIS — I10 Essential (primary) hypertension: Secondary | ICD-10-CM | POA: Diagnosis not present

## 2021-05-10 DIAGNOSIS — I2510 Atherosclerotic heart disease of native coronary artery without angina pectoris: Secondary | ICD-10-CM | POA: Diagnosis not present

## 2021-05-10 DIAGNOSIS — R6889 Other general symptoms and signs: Secondary | ICD-10-CM | POA: Diagnosis not present

## 2021-05-10 DIAGNOSIS — E039 Hypothyroidism, unspecified: Secondary | ICD-10-CM | POA: Diagnosis not present

## 2021-05-10 DIAGNOSIS — N1832 Chronic kidney disease, stage 3b: Secondary | ICD-10-CM | POA: Diagnosis not present

## 2021-05-24 DIAGNOSIS — H02121 Mechanical ectropion of right upper eyelid: Secondary | ICD-10-CM | POA: Diagnosis not present

## 2021-05-24 DIAGNOSIS — H02124 Mechanical ectropion of left upper eyelid: Secondary | ICD-10-CM | POA: Diagnosis not present

## 2021-05-24 DIAGNOSIS — H02125 Mechanical ectropion of left lower eyelid: Secondary | ICD-10-CM | POA: Diagnosis not present

## 2021-05-24 DIAGNOSIS — H02122 Mechanical ectropion of right lower eyelid: Secondary | ICD-10-CM | POA: Diagnosis not present

## 2021-05-24 DIAGNOSIS — H401131 Primary open-angle glaucoma, bilateral, mild stage: Secondary | ICD-10-CM | POA: Diagnosis not present

## 2021-05-28 DIAGNOSIS — H35033 Hypertensive retinopathy, bilateral: Secondary | ICD-10-CM | POA: Diagnosis not present

## 2021-05-28 DIAGNOSIS — I10 Essential (primary) hypertension: Secondary | ICD-10-CM | POA: Diagnosis not present

## 2021-05-28 DIAGNOSIS — E782 Mixed hyperlipidemia: Secondary | ICD-10-CM | POA: Diagnosis not present

## 2021-05-28 DIAGNOSIS — H409 Unspecified glaucoma: Secondary | ICD-10-CM | POA: Diagnosis not present

## 2021-05-28 DIAGNOSIS — I2510 Atherosclerotic heart disease of native coronary artery without angina pectoris: Secondary | ICD-10-CM | POA: Diagnosis not present

## 2021-05-28 DIAGNOSIS — E039 Hypothyroidism, unspecified: Secondary | ICD-10-CM | POA: Diagnosis not present

## 2021-05-28 DIAGNOSIS — E1122 Type 2 diabetes mellitus with diabetic chronic kidney disease: Secondary | ICD-10-CM | POA: Diagnosis not present

## 2021-05-28 DIAGNOSIS — N1832 Chronic kidney disease, stage 3b: Secondary | ICD-10-CM | POA: Diagnosis not present

## 2021-06-22 DIAGNOSIS — Z1389 Encounter for screening for other disorder: Secondary | ICD-10-CM | POA: Diagnosis not present

## 2021-06-22 DIAGNOSIS — Z0000 Encounter for general adult medical examination without abnormal findings: Secondary | ICD-10-CM | POA: Diagnosis not present

## 2021-07-13 DIAGNOSIS — H409 Unspecified glaucoma: Secondary | ICD-10-CM | POA: Diagnosis not present

## 2021-07-13 DIAGNOSIS — N1832 Chronic kidney disease, stage 3b: Secondary | ICD-10-CM | POA: Diagnosis not present

## 2021-07-13 DIAGNOSIS — E039 Hypothyroidism, unspecified: Secondary | ICD-10-CM | POA: Diagnosis not present

## 2021-07-13 DIAGNOSIS — H35033 Hypertensive retinopathy, bilateral: Secondary | ICD-10-CM | POA: Diagnosis not present

## 2021-07-13 DIAGNOSIS — E782 Mixed hyperlipidemia: Secondary | ICD-10-CM | POA: Diagnosis not present

## 2021-07-13 DIAGNOSIS — I2510 Atherosclerotic heart disease of native coronary artery without angina pectoris: Secondary | ICD-10-CM | POA: Diagnosis not present

## 2021-07-13 DIAGNOSIS — I10 Essential (primary) hypertension: Secondary | ICD-10-CM | POA: Diagnosis not present

## 2021-07-13 DIAGNOSIS — E1122 Type 2 diabetes mellitus with diabetic chronic kidney disease: Secondary | ICD-10-CM | POA: Diagnosis not present

## 2021-08-23 DIAGNOSIS — H524 Presbyopia: Secondary | ICD-10-CM | POA: Diagnosis not present

## 2021-08-23 DIAGNOSIS — H401131 Primary open-angle glaucoma, bilateral, mild stage: Secondary | ICD-10-CM | POA: Diagnosis not present

## 2021-08-23 DIAGNOSIS — Z961 Presence of intraocular lens: Secondary | ICD-10-CM | POA: Diagnosis not present

## 2021-08-23 DIAGNOSIS — E113293 Type 2 diabetes mellitus with mild nonproliferative diabetic retinopathy without macular edema, bilateral: Secondary | ICD-10-CM | POA: Diagnosis not present

## 2021-08-23 DIAGNOSIS — Z7984 Long term (current) use of oral hypoglycemic drugs: Secondary | ICD-10-CM | POA: Diagnosis not present

## 2021-09-02 DIAGNOSIS — I10 Essential (primary) hypertension: Secondary | ICD-10-CM | POA: Diagnosis not present

## 2021-09-02 DIAGNOSIS — H35033 Hypertensive retinopathy, bilateral: Secondary | ICD-10-CM | POA: Diagnosis not present

## 2021-09-02 DIAGNOSIS — E782 Mixed hyperlipidemia: Secondary | ICD-10-CM | POA: Diagnosis not present

## 2021-09-02 DIAGNOSIS — N1832 Chronic kidney disease, stage 3b: Secondary | ICD-10-CM | POA: Diagnosis not present

## 2021-09-02 DIAGNOSIS — E1122 Type 2 diabetes mellitus with diabetic chronic kidney disease: Secondary | ICD-10-CM | POA: Diagnosis not present

## 2021-09-02 DIAGNOSIS — H409 Unspecified glaucoma: Secondary | ICD-10-CM | POA: Diagnosis not present

## 2021-09-02 DIAGNOSIS — I2510 Atherosclerotic heart disease of native coronary artery without angina pectoris: Secondary | ICD-10-CM | POA: Diagnosis not present

## 2021-09-02 DIAGNOSIS — E039 Hypothyroidism, unspecified: Secondary | ICD-10-CM | POA: Diagnosis not present

## 2021-11-09 DIAGNOSIS — N1832 Chronic kidney disease, stage 3b: Secondary | ICD-10-CM | POA: Diagnosis not present

## 2021-11-09 DIAGNOSIS — E039 Hypothyroidism, unspecified: Secondary | ICD-10-CM | POA: Diagnosis not present

## 2021-11-09 DIAGNOSIS — E559 Vitamin D deficiency, unspecified: Secondary | ICD-10-CM | POA: Diagnosis not present

## 2021-11-09 DIAGNOSIS — E1122 Type 2 diabetes mellitus with diabetic chronic kidney disease: Secondary | ICD-10-CM | POA: Diagnosis not present

## 2021-11-16 DIAGNOSIS — M85852 Other specified disorders of bone density and structure, left thigh: Secondary | ICD-10-CM | POA: Diagnosis not present

## 2021-11-16 DIAGNOSIS — E538 Deficiency of other specified B group vitamins: Secondary | ICD-10-CM | POA: Diagnosis not present

## 2021-11-16 DIAGNOSIS — I2510 Atherosclerotic heart disease of native coronary artery without angina pectoris: Secondary | ICD-10-CM | POA: Diagnosis not present

## 2021-11-16 DIAGNOSIS — E782 Mixed hyperlipidemia: Secondary | ICD-10-CM | POA: Diagnosis not present

## 2021-11-16 DIAGNOSIS — E1122 Type 2 diabetes mellitus with diabetic chronic kidney disease: Secondary | ICD-10-CM | POA: Diagnosis not present

## 2021-11-16 DIAGNOSIS — E039 Hypothyroidism, unspecified: Secondary | ICD-10-CM | POA: Diagnosis not present

## 2021-11-16 DIAGNOSIS — Z7984 Long term (current) use of oral hypoglycemic drugs: Secondary | ICD-10-CM | POA: Diagnosis not present

## 2021-11-16 DIAGNOSIS — I10 Essential (primary) hypertension: Secondary | ICD-10-CM | POA: Diagnosis not present

## 2021-11-16 DIAGNOSIS — N1832 Chronic kidney disease, stage 3b: Secondary | ICD-10-CM | POA: Diagnosis not present

## 2021-11-16 DIAGNOSIS — L03032 Cellulitis of left toe: Secondary | ICD-10-CM | POA: Diagnosis not present

## 2021-11-16 DIAGNOSIS — E559 Vitamin D deficiency, unspecified: Secondary | ICD-10-CM | POA: Diagnosis not present

## 2021-11-16 DIAGNOSIS — B351 Tinea unguium: Secondary | ICD-10-CM | POA: Diagnosis not present

## 2021-11-22 DIAGNOSIS — N1832 Chronic kidney disease, stage 3b: Secondary | ICD-10-CM | POA: Diagnosis not present

## 2021-11-22 DIAGNOSIS — I10 Essential (primary) hypertension: Secondary | ICD-10-CM | POA: Diagnosis not present

## 2021-11-22 DIAGNOSIS — E039 Hypothyroidism, unspecified: Secondary | ICD-10-CM | POA: Diagnosis not present

## 2021-11-22 DIAGNOSIS — E1122 Type 2 diabetes mellitus with diabetic chronic kidney disease: Secondary | ICD-10-CM | POA: Diagnosis not present

## 2021-11-22 DIAGNOSIS — H35033 Hypertensive retinopathy, bilateral: Secondary | ICD-10-CM | POA: Diagnosis not present

## 2021-11-22 DIAGNOSIS — E782 Mixed hyperlipidemia: Secondary | ICD-10-CM | POA: Diagnosis not present

## 2021-11-22 DIAGNOSIS — I2510 Atherosclerotic heart disease of native coronary artery without angina pectoris: Secondary | ICD-10-CM | POA: Diagnosis not present

## 2021-11-22 DIAGNOSIS — H409 Unspecified glaucoma: Secondary | ICD-10-CM | POA: Diagnosis not present

## 2022-01-17 DIAGNOSIS — H401131 Primary open-angle glaucoma, bilateral, mild stage: Secondary | ICD-10-CM | POA: Diagnosis not present

## 2022-01-17 DIAGNOSIS — H1045 Other chronic allergic conjunctivitis: Secondary | ICD-10-CM | POA: Diagnosis not present

## 2022-01-17 DIAGNOSIS — E119 Type 2 diabetes mellitus without complications: Secondary | ICD-10-CM | POA: Diagnosis not present

## 2022-05-17 DIAGNOSIS — M85852 Other specified disorders of bone density and structure, left thigh: Secondary | ICD-10-CM | POA: Diagnosis not present

## 2022-05-17 DIAGNOSIS — I2510 Atherosclerotic heart disease of native coronary artery without angina pectoris: Secondary | ICD-10-CM | POA: Diagnosis not present

## 2022-05-17 DIAGNOSIS — E538 Deficiency of other specified B group vitamins: Secondary | ICD-10-CM | POA: Diagnosis not present

## 2022-05-17 DIAGNOSIS — E039 Hypothyroidism, unspecified: Secondary | ICD-10-CM | POA: Diagnosis not present

## 2022-05-17 DIAGNOSIS — E1122 Type 2 diabetes mellitus with diabetic chronic kidney disease: Secondary | ICD-10-CM | POA: Diagnosis not present

## 2022-05-17 DIAGNOSIS — E782 Mixed hyperlipidemia: Secondary | ICD-10-CM | POA: Diagnosis not present

## 2022-05-17 DIAGNOSIS — N1832 Chronic kidney disease, stage 3b: Secondary | ICD-10-CM | POA: Diagnosis not present

## 2022-05-17 DIAGNOSIS — Z23 Encounter for immunization: Secondary | ICD-10-CM | POA: Diagnosis not present

## 2022-05-17 DIAGNOSIS — E559 Vitamin D deficiency, unspecified: Secondary | ICD-10-CM | POA: Diagnosis not present

## 2022-05-17 DIAGNOSIS — R801 Persistent proteinuria, unspecified: Secondary | ICD-10-CM | POA: Diagnosis not present

## 2022-05-17 DIAGNOSIS — I10 Essential (primary) hypertension: Secondary | ICD-10-CM | POA: Diagnosis not present

## 2022-05-19 ENCOUNTER — Telehealth: Payer: Self-pay

## 2022-05-19 NOTE — Telephone Encounter (Signed)
NOTES SCANNED TO REFERRAL 

## 2022-05-25 NOTE — Progress Notes (Unsigned)
Cardiology Clinic Note   Patient Name: Claire Blake Date of Encounter: 05/25/2022  Primary Care Provider:  Cari Caraway, MD Primary Cardiologist:  Pixie Casino, MD  Patient Profile    Claire Blake 81 year old female presents the clinic today for evaluation of her possible cardiac murmur and coronary calcification.  Past Medical History    Past Medical History:  Diagnosis Date   Anemia    hx   Bronchitis    hx   CKD (chronic kidney disease), stage III ([place])    Diabetes mellitus without complication ([place])    Glaucoma    Hyperlipidemia    Hypertension    Hypothyroidism    Obesity    Pneumonia    hx   Rheumatic fever    81 yrs old   Thyroid disease    Past Surgical History:  Procedure Laterality Date   ABDOMINAL HYSTERECTOMY     partial   ANKLE FRACTURE SURGERY     BACK SURGERY  2006   ruptured disc   CHOLECYSTECTOMY N/A 01/16/2014   Procedure: LAPAROSCOPIC CHOLECYSTECTOMY WITH INTRAOPERATIVE CHOLANGIOGRAM;  Surgeon: Imogene Burn. Georgette Dover, MD;  Location: [facility];  Service: General;  Laterality: N/A;   ESOPHAGOGASTRODUODENOSCOPY N/A 01/16/2014   Procedure: ESOPHAGOGASTRODUODENOSCOPY (EGD);  Surgeon: Cleotis Nipper, MD;  Location: [facility] ENDOSCOPY;  Service: Endoscopy;  Laterality: N/A;    Allergies  Allergies  Allergen Reactions   Zocor [Simvastatin]     Leg cramps    History of Present Illness    [name] is a PMH of HTN, HLD, moderate obesity, shortness of breath, and coronary artery calcification.  She was initially referred by her PCP for evaluation of her shortness of breath.  She is from [facility].  She reported several weeks of progressive shortness of breath.  Her PMH also includes episodes of bronchitis.  She reported that her mother had COPD and died from this and her 64s.  She underwent evaluation by her PCP and was noted to have an abnormal D-dimer.  She subsequently had lower extremity arterial Dopplers which were negative for DVT and chest  CT which showed no PE.  She was noted to have aged advanced coronary atherosclerosis and borderline cardiomegaly.  Her EKG indicated inferior infarct pattern.  During that time she denied chest pain but reported exertional dyspnea.  She indicated that she had been sleeping upright in a recliner for years.  She denied orthopnea PND and signs of heart failure.  She was seen in follow-up by Dr. Debara Pickett on 09/26/2018.  Her echocardiogram showed normal systolic function with mild diastolic dysfunction.  Her BNP was low and her metabolic panel was normal.  She denied worsening shortness of breath.  She denied chest pain.  She was seen again in follow-up on 12/24/2018.  She had been referred to pulmonology and saw Dr.Icard.  He performed a CT scan which showed a nodule and pulmonary artery enlargement concerning for pulmonary hypertension.  She was unable to complete stress testing due to bouts of diverticulitis and bronchitis in December.  She was seen in follow-up by Dr. Debara Pickett on 02/16/2021.  During that time cardiac catheterization as well as stress testing was discussed.  She unfortunately did not follow-up with the testing.  She was referred back to cardiology by her PCP.  She reported that her breathing had improved.  She had lost 40 pounds and was feeling much better.  It was felt that her weight was probably contributing to her symptoms.  She also noted recent CKD.  She was not felt to be a good candidate for catheterization.  She presents to the clinic today for follow-up evaluation states***  *** denies chest pain, shortness of breath, lower extremity edema, fatigue, palpitations, melena, hematuria, hemoptysis, diaphoresis, weakness, presyncope, syncope, orthopnea, and PND.  Home Medications    Prior to Admission medications   Medication Sig Start Date End Date Taking? Authorizing Provider  amLODipine (NORVASC) 5 MG tablet Take 5 mg by mouth daily.    [provider]  aspirin EC 81 MG tablet  Take 81 mg by mouth every evening.     [provider]  Blood Glucose Monitoring Suppl (ONE TOUCH ULTRA SYSTEM KIT) W/DEVICE KIT 1 kit by Does not apply route once.    [provider]  calcium carbonate (CALCIUM 600) 600 MG TABS tablet Take 600 mg by mouth daily.    [provider]  Cholecalciferol (VITAMIN D3) 50 MCG (2000 UT) TABS Take 1 tablet by mouth daily.    [provider]  Empagliflozin-metFORMIN HCl ER (SYNJARDY XR) 25-1000 MG TB24 Take 1 tablet by mouth in the morning.    [provider]  glipiZIDE (GLUCOTROL) 10 MG tablet Take 10 mg by mouth 2 (two) times daily before a meal.    [provider]  latanoprost (XALATAN) 0.005 % ophthalmic solution Place 1 drop into both eyes at bedtime.    [provider]  levothyroxine (SYNTHROID, LEVOTHROID) 88 MCG tablet Take 44-88 mcg by mouth See admin instructions. Take 0.5 tablet (44 mcg) by mouth on Saturdays, then take 1 tablet (88 mcg) by mouth on all other days in the morning before breakfast.    [provider]  lisinopril (PRINIVIL,ZESTRIL) 40 MG tablet Take 40 mg by mouth every evening.     [provider]  rosuvastatin (CRESTOR) 20 MG tablet Take 20 mg by mouth every evening.  07/03/18   [provider]    Family History    Family History  Problem Relation Age of Onset   COPD Mother    Cancer Father        throat   Cancer Maternal Grandmother        bilateral breast   Breast cancer Maternal Grandmother 78   She indicated that her mother is deceased. She indicated that her father is deceased. She indicated that her maternal grandmother is deceased.  Social History    Social History   Socioeconomic History   Marital status: Divorced    Spouse name: Not on file   Number of children: Not on file   Years of education: Not on file   Highest education level: Not on file  Occupational History   Not on file  Tobacco Use   Smoking status: Never    Smokeless tobacco: Never  Substance and Sexual Activity   Alcohol use: No   Drug use: No   Sexual activity: Not on file  Other Topics Concern   Not on file  Social History Narrative   Not on file   Social Determinants of Health   Financial Resource Strain: Not on file  Food Insecurity: Not on file  Transportation Needs: Not on file  Physical Activity: Not on file  Stress: Not on file  Social Connections: Not on file  Intimate Partner Violence: Not on file     Review of Systems    General:  No chills, fever, night sweats or weight changes.  Cardiovascular:  No chest pain, dyspnea  on exertion, edema, orthopnea, palpitations, paroxysmal nocturnal dyspnea. Dermatological: No rash, lesions/masses Respiratory: No cough, dyspnea Urologic: No hematuria, dysuria Abdominal:   No nausea, vomiting, diarrhea, bright red blood per rectum, melena, or hematemesis Neurologic:  No visual changes, wkns, changes in mental status. All other systems reviewed and are otherwise negative except as noted above.  Physical Exam    VS:  There were no vitals taken for this visit. , BMI There is no height or weight on file to calculate BMI. GEN: Well nourished, well developed, in no acute distress. HEENT: normal. Neck: Supple, no JVD, carotid bruits, or masses. Cardiac: RRR, no murmurs, rubs, or gallops. No clubbing, cyanosis, edema.  Radials/DP/PT 2+ and equal bilaterally.  Respiratory:  Respirations regular and unlabored, clear to auscultation bilaterally. GI: Soft, nontender, nondistended, BS + x 4. MS: no deformity or atrophy. Skin: warm and dry, no rash. Neuro:  Strength and sensation are intact. Psych: Normal affect.  Accessory Clinical Findings    Recent Labs: No results found for requested labs within last 365 days.   Recent Lipid Panel No results found for: "CHOL", "TRIG", "HDL", "CHOLHDL", "VLDL", "LDLCALC", "LDLDIRECT"  ECG personally reviewed by me today- *** - No acute  changes  Echocardiogram    Assessment & Plan   1.  DOE-no increased DOE or activity intolerance.  Weight stable.  Continues to be somewhat active and lose weight.  Previously had work-up by pulmonology who question pulmonary hypertension.   Heart healthy low-sodium diet-salty 6 given Increase physical activity as tolerated Continue weight loss  Coronary artery calcification-no episodes of arm neck back or chest discomfort.  Not a good candidate for nuclear stress testing due to CKD.  Noted to have multivessel coronary disease on chest CT. Continue aspirin, amlodipine Heart healthy low-sodium diet Increase physical activity as tolerated  Hyperlipidemia-LDL*** Continue rosuvastatin, aspirin Heart healthy low-sodium high-fiber diet Increase physical activity as tolerated  Essential hypertension-BP today***.  Well-controlled at home. Continue lisinopril, amlodipine Heart healthy low-sodium diet-salty 6 given Increase physical activity as tolerated  CKD stage III-creatinine 1.5 on 11/21/2018 Follows with PCP  Moderate obesity-weight today***. Continue weight loss Increase physical activity as tolerated  Disposition: Follow-up with Dr. Debara Pickett in 12 months.    Jossie Ng. Clementine Soulliere NP-C    05/25/2022, 6:31 AM [place] [address] Office [phone] Fax [phone]  Notice: This dictation was prepared with Dragon dictation along with smaller phrase technology. Any transcriptional errors that result from this process are unintentional and may not be corrected upon review.  I spent***minutes examining this patient, reviewing medications, and using patient centered shared decision making involving her cardiac care.  Prior to her visit I spent greater than 20 minutes reviewing her past medical history,  medications, and prior cardiac tests.

## 2022-05-26 DIAGNOSIS — H0288B Meibomian gland dysfunction left eye, upper and lower eyelids: Secondary | ICD-10-CM | POA: Diagnosis not present

## 2022-05-26 DIAGNOSIS — H0288A Meibomian gland dysfunction right eye, upper and lower eyelids: Secondary | ICD-10-CM | POA: Diagnosis not present

## 2022-05-26 DIAGNOSIS — H401131 Primary open-angle glaucoma, bilateral, mild stage: Secondary | ICD-10-CM | POA: Diagnosis not present

## 2022-05-27 ENCOUNTER — Ambulatory Visit: Payer: Medicare Other | Admitting: General Practice

## 2022-05-27 ENCOUNTER — Encounter: Payer: Self-pay | Admitting: General Practice

## 2022-05-27 VITALS — BP 130/62 | HR 73 | Ht 67.0 in | Wt 211.0 lb

## 2022-05-27 DIAGNOSIS — E668 Other obesity: Secondary | ICD-10-CM

## 2022-05-27 DIAGNOSIS — R0609 Other forms of dyspnea: Secondary | ICD-10-CM | POA: Diagnosis not present

## 2022-05-27 DIAGNOSIS — I10 Essential (primary) hypertension: Secondary | ICD-10-CM | POA: Diagnosis not present

## 2022-05-27 DIAGNOSIS — I2510 Atherosclerotic heart disease of native coronary artery without angina pectoris: Secondary | ICD-10-CM

## 2022-05-27 DIAGNOSIS — I2584 Coronary atherosclerosis due to calcified coronary lesion: Secondary | ICD-10-CM

## 2022-05-27 DIAGNOSIS — E782 Mixed hyperlipidemia: Secondary | ICD-10-CM | POA: Diagnosis not present

## 2022-06-16 ENCOUNTER — Ambulatory Visit (HOSPITAL_COMMUNITY): Payer: Medicare Other | Attending: Internal Medicine

## 2022-06-16 DIAGNOSIS — R0609 Other forms of dyspnea: Secondary | ICD-10-CM | POA: Diagnosis not present

## 2022-06-16 LAB — ECHOCARDIOGRAM COMPLETE
AR max vel: 2.21 cm2
AV Area VTI: 2.12 cm2
AV Area mean vel: 2.11 cm2
AV Mean grad: 6.7 mmHg
AV Peak grad: 12.3 mmHg
Ao pk vel: 1.76 m/s
Area-P 1/2: 2.61 cm2
S' Lateral: 2.3 cm

## 2022-06-23 DIAGNOSIS — Z0000 Encounter for general adult medical examination without abnormal findings: Secondary | ICD-10-CM | POA: Diagnosis not present

## 2022-08-01 DIAGNOSIS — S63501A Unspecified sprain of right wrist, initial encounter: Secondary | ICD-10-CM | POA: Diagnosis not present

## 2022-08-01 DIAGNOSIS — M19031 Primary osteoarthritis, right wrist: Secondary | ICD-10-CM | POA: Diagnosis not present

## 2022-08-01 DIAGNOSIS — B372 Candidiasis of skin and nail: Secondary | ICD-10-CM | POA: Diagnosis not present

## 2022-08-01 DIAGNOSIS — M25531 Pain in right wrist: Secondary | ICD-10-CM | POA: Diagnosis not present

## 2022-08-01 DIAGNOSIS — L22 Diaper dermatitis: Secondary | ICD-10-CM | POA: Diagnosis not present

## 2022-08-01 DIAGNOSIS — J340 Abscess, furuncle and carbuncle of nose: Secondary | ICD-10-CM | POA: Diagnosis not present

## 2022-08-12 DIAGNOSIS — L0591 Pilonidal cyst without abscess: Secondary | ICD-10-CM | POA: Diagnosis not present

## 2022-08-12 DIAGNOSIS — J340 Abscess, furuncle and carbuncle of nose: Secondary | ICD-10-CM | POA: Diagnosis not present

## 2022-08-12 DIAGNOSIS — Z23 Encounter for immunization: Secondary | ICD-10-CM | POA: Diagnosis not present

## 2022-08-12 DIAGNOSIS — M25531 Pain in right wrist: Secondary | ICD-10-CM | POA: Diagnosis not present

## 2022-09-23 DIAGNOSIS — M13841 Other specified arthritis, right hand: Secondary | ICD-10-CM | POA: Diagnosis not present

## 2022-09-23 DIAGNOSIS — M25531 Pain in right wrist: Secondary | ICD-10-CM | POA: Diagnosis not present

## 2022-11-15 DIAGNOSIS — E1122 Type 2 diabetes mellitus with diabetic chronic kidney disease: Secondary | ICD-10-CM | POA: Diagnosis not present

## 2022-11-15 DIAGNOSIS — R801 Persistent proteinuria, unspecified: Secondary | ICD-10-CM | POA: Diagnosis not present

## 2022-11-15 DIAGNOSIS — E039 Hypothyroidism, unspecified: Secondary | ICD-10-CM | POA: Diagnosis not present

## 2022-11-17 DIAGNOSIS — R801 Persistent proteinuria, unspecified: Secondary | ICD-10-CM | POA: Diagnosis not present

## 2022-11-17 DIAGNOSIS — M85852 Other specified disorders of bone density and structure, left thigh: Secondary | ICD-10-CM | POA: Diagnosis not present

## 2022-11-17 DIAGNOSIS — I2510 Atherosclerotic heart disease of native coronary artery without angina pectoris: Secondary | ICD-10-CM | POA: Diagnosis not present

## 2022-11-17 DIAGNOSIS — N189 Chronic kidney disease, unspecified: Secondary | ICD-10-CM | POA: Diagnosis not present

## 2022-11-17 DIAGNOSIS — Z862 Personal history of diseases of the blood and blood-forming organs and certain disorders involving the immune mechanism: Secondary | ICD-10-CM | POA: Diagnosis not present

## 2022-11-17 DIAGNOSIS — E559 Vitamin D deficiency, unspecified: Secondary | ICD-10-CM | POA: Diagnosis not present

## 2022-11-17 DIAGNOSIS — I10 Essential (primary) hypertension: Secondary | ICD-10-CM | POA: Diagnosis not present

## 2022-11-17 DIAGNOSIS — E1122 Type 2 diabetes mellitus with diabetic chronic kidney disease: Secondary | ICD-10-CM | POA: Diagnosis not present

## 2022-11-17 DIAGNOSIS — E782 Mixed hyperlipidemia: Secondary | ICD-10-CM | POA: Diagnosis not present

## 2022-11-17 DIAGNOSIS — E039 Hypothyroidism, unspecified: Secondary | ICD-10-CM | POA: Diagnosis not present

## 2022-11-17 DIAGNOSIS — E538 Deficiency of other specified B group vitamins: Secondary | ICD-10-CM | POA: Diagnosis not present

## 2022-11-30 DIAGNOSIS — H16223 Keratoconjunctivitis sicca, not specified as Sjogren's, bilateral: Secondary | ICD-10-CM | POA: Diagnosis not present

## 2022-11-30 DIAGNOSIS — H401131 Primary open-angle glaucoma, bilateral, mild stage: Secondary | ICD-10-CM | POA: Diagnosis not present

## 2022-11-30 DIAGNOSIS — Z7984 Long term (current) use of oral hypoglycemic drugs: Secondary | ICD-10-CM | POA: Diagnosis not present

## 2022-11-30 DIAGNOSIS — E119 Type 2 diabetes mellitus without complications: Secondary | ICD-10-CM | POA: Diagnosis not present

## 2022-11-30 DIAGNOSIS — Z961 Presence of intraocular lens: Secondary | ICD-10-CM | POA: Diagnosis not present

## 2023-02-13 DIAGNOSIS — K5903 Drug induced constipation: Secondary | ICD-10-CM | POA: Diagnosis not present

## 2023-02-13 DIAGNOSIS — I2510 Atherosclerotic heart disease of native coronary artery without angina pectoris: Secondary | ICD-10-CM | POA: Diagnosis not present

## 2023-02-13 DIAGNOSIS — N189 Chronic kidney disease, unspecified: Secondary | ICD-10-CM | POA: Diagnosis not present

## 2023-02-13 DIAGNOSIS — E1165 Type 2 diabetes mellitus with hyperglycemia: Secondary | ICD-10-CM | POA: Diagnosis not present

## 2023-02-13 DIAGNOSIS — N1832 Chronic kidney disease, stage 3b: Secondary | ICD-10-CM | POA: Diagnosis not present

## 2023-02-13 DIAGNOSIS — E1122 Type 2 diabetes mellitus with diabetic chronic kidney disease: Secondary | ICD-10-CM | POA: Diagnosis not present

## 2023-02-13 DIAGNOSIS — I10 Essential (primary) hypertension: Secondary | ICD-10-CM | POA: Diagnosis not present

## 2023-02-13 DIAGNOSIS — I700 Atherosclerosis of aorta: Secondary | ICD-10-CM | POA: Diagnosis not present

## 2023-02-13 DIAGNOSIS — R801 Persistent proteinuria, unspecified: Secondary | ICD-10-CM | POA: Diagnosis not present

## 2023-02-13 DIAGNOSIS — M85852 Other specified disorders of bone density and structure, left thigh: Secondary | ICD-10-CM | POA: Diagnosis not present

## 2023-03-07 DIAGNOSIS — H401131 Primary open-angle glaucoma, bilateral, mild stage: Secondary | ICD-10-CM | POA: Diagnosis not present

## 2023-05-17 DIAGNOSIS — E1165 Type 2 diabetes mellitus with hyperglycemia: Secondary | ICD-10-CM | POA: Diagnosis not present

## 2023-05-17 DIAGNOSIS — I10 Essential (primary) hypertension: Secondary | ICD-10-CM | POA: Diagnosis not present

## 2023-05-17 DIAGNOSIS — Z993 Dependence on wheelchair: Secondary | ICD-10-CM | POA: Diagnosis not present

## 2023-05-17 DIAGNOSIS — Z79899 Other long term (current) drug therapy: Secondary | ICD-10-CM | POA: Diagnosis not present

## 2023-05-23 NOTE — Progress Notes (Unsigned)
Cardiology Clinic Note   Patient Name: Claire Blake Date of Encounter: 06/01/2023  Primary Care Provider:  Gweneth Dimitri, MD Primary Cardiologist:  Chrystie Nose, MD  Patient Profile    Claire Blake 82 year old female presents the clinic today for follow-up evaluation of her ascending aortic aneurysm and coronary calcification.  Past Medical History    Past Medical History:  Diagnosis Date   Anemia    hx   Bronchitis    hx   CKD (chronic kidney disease), stage III (HCC)    Diabetes mellitus without complication (HCC)    Glaucoma    Hyperlipidemia    Hypertension    Hypothyroidism    Obesity    Pneumonia    hx   Rheumatic fever    82 yrs old   Thyroid disease    Past Surgical History:  Procedure Laterality Date   ABDOMINAL HYSTERECTOMY     partial   ANKLE FRACTURE SURGERY     BACK SURGERY  2006   ruptured disc   CHOLECYSTECTOMY N/A 01/16/2014   Procedure: LAPAROSCOPIC CHOLECYSTECTOMY WITH INTRAOPERATIVE CHOLANGIOGRAM;  Surgeon: Wilmon Arms. Corliss Skains, MD;  Location: [facility] OR;  Service: General;  Laterality: N/A;   ESOPHAGOGASTRODUODENOSCOPY N/A 01/16/2014   Procedure: ESOPHAGOGASTRODUODENOSCOPY (EGD);  Surgeon: Florencia Reasons, MD;  Location: [facility] ENDOSCOPY;  Service: Endoscopy;  Laterality: N/A;    Allergies  Allergies  Allergen Reactions   Zocor [Simvastatin]     Leg cramps    History of Present Illness    Claire Blake is a PMH of HTN, HLD, moderate obesity, shortness of breath, and coronary artery calcification.  She was initially referred by her PCP for evaluation of her shortness of breath.  She is from [facility].  She reported several weeks of progressive shortness of breath.  Her PMH also includes episodes of bronchitis.  She reported that her mother had COPD and died from this and her 60s.  She underwent evaluation by her PCP and was noted to have an abnormal D-dimer.  She subsequently had lower extremity arterial Dopplers which were negative for DVT  and chest CT which showed no PE.  She was noted to have aged advanced coronary atherosclerosis and borderline cardiomegaly.  Her EKG indicated inferior infarct pattern.  During that time she denied chest pain but reported exertional dyspnea.  She indicated that she had been sleeping upright in a recliner for years.  She denied orthopnea PND and signs of heart failure.  She was seen in follow-up by Dr. Rennis Golden on 09/26/2018.  Her echocardiogram showed normal systolic function with mild diastolic dysfunction.  Her BNP was low and her metabolic panel was normal.  She denied worsening shortness of breath.  She denied chest pain.  She was seen again in follow-up on 12/24/2018.  She had been referred to pulmonology and saw Dr.Icard.  He performed a CT scan which showed a nodule and pulmonary artery enlargement concerning for pulmonary hypertension.  She was unable to complete stress testing due to bouts of diverticulitis and bronchitis in December.  She was seen in follow-up by Dr. Rennis Golden on 02/16/2021.  During that time cardiac catheterization as well as stress testing was discussed.  She unfortunately did not follow-up with the testing.  She was referred back to cardiology by her PCP.  She reported that her breathing had improved.  She had lost 40 pounds and was feeling much better.  It was felt that her weight was probably contributing to her symptoms.  She also noted recent CKD.  She was not felt to be a good candidate for catheterization.  She presented to the clinic 05/27/2022 for follow-up evaluation stated she felt well.  She continued to be limited in her physical activity due to her back discomfort.  She  had back surgery several years ago.  She reported that she was at her PCP who heard a cardiac murmur and asked her to follow-up with cardiology.  She felt that her breathing had somewhat improved with her continued weight loss.  Her blood pressure was well controlled  at 130/62.  She reported similar control  at home.  On exam I did not hear a cardiac murmur.  Due to her longstanding DOE  her echocardiogram was repeated and follow-up in 12 months was planned.  Her echocardiogram 06/16/2022 showed normal LVEF, G1 DD, ascending aortic aneurysm measuring 42 mm and no significant valvular abnormalities.  She presents to the clinic today for follow-up evaluation and states she continues to lose weight.  She has reduced the calories in her diet.  She is limited in her physical activity due to back pain and leg pain.  She does chair exercises.  We reviewed her previous echocardiogram which showed ascending aortic aneurysm 42 mm.  Her blood pressure is well-controlled.  I will continue her current medication regimen and plan for repeat echocardiogram.  We will plan follow-up in 12 months.  Today she denies chest pain, shortness of breath, lower extremity edema, fatigue, palpitations, melena, hematuria, hemoptysis, diaphoresis, weakness, presyncope, syncope, orthopnea, and PND.  Home Medications    Prior to Admission medications   Medication Sig Start Date End Date Taking? Authorizing Provider  amLODipine (NORVASC) 5 MG tablet Take 5 mg by mouth daily.    [provider]  aspirin EC 81 MG tablet Take 81 mg by mouth every evening.     [provider]  Blood Glucose Monitoring Suppl (ONE TOUCH ULTRA SYSTEM KIT) W/DEVICE KIT 1 kit by Does not apply route once.    [provider]  calcium carbonate (CALCIUM 600) 600 MG TABS tablet Take 600 mg by mouth daily.    [provider]  Cholecalciferol (VITAMIN D3) 50 MCG (2000 UT) TABS Take 1 tablet by mouth daily.    [provider]  Empagliflozin-metFORMIN HCl ER (SYNJARDY XR) 25-1000 MG TB24 Take 1 tablet by mouth in the morning.    [provider]  glipiZIDE (GLUCOTROL) 10 MG tablet Take 10 mg by mouth 2 (two) times daily before a meal.    [provider]  latanoprost (XALATAN) 0.005 % ophthalmic solution  Place 1 drop into both eyes at bedtime.    [provider]  levothyroxine (SYNTHROID, LEVOTHROID) 88 MCG tablet Take 44-88 mcg by mouth See admin instructions. Take 0.5 tablet (44 mcg) by mouth on Saturdays, then take 1 tablet (88 mcg) by mouth on all other days in the morning before breakfast.    [provider]  lisinopril (PRINIVIL,ZESTRIL) 40 MG tablet Take 40 mg by mouth every evening.     [provider]  rosuvastatin (CRESTOR) 20 MG tablet Take 20 mg by mouth every evening.  07/03/18   [provider]    Family History    Family History  Problem Relation Age of Onset   COPD Mother    Cancer Father        throat   Cancer Maternal Grandmother        bilateral breast   Breast cancer  Maternal Grandmother 65   She indicated that her mother is deceased. She indicated that her father is deceased. She indicated that her maternal grandmother is deceased.  Social History    Social History   Socioeconomic History   Marital status: Divorced    Spouse name: Not on file   Number of children: Not on file   Years of education: Not on file   Highest education level: Not on file  Occupational History   Not on file  Tobacco Use   Smoking status: Never   Smokeless tobacco: Never  Substance and Sexual Activity   Alcohol use: No   Drug use: No   Sexual activity: Not on file  Other Topics Concern   Not on file  Social History Narrative   Not on file   Social Determinants of Health   Financial Resource Strain: Not on file  Food Insecurity: Not on file  Transportation Needs: Not on file  Physical Activity: Not on file  Stress: Not on file  Social Connections: Not on file  Intimate Partner Violence: Not on file     Review of Systems    General:  No chills, fever, night sweats or weight changes.  Cardiovascular:  No chest pain, dyspnea on exertion, edema, orthopnea, palpitations, paroxysmal nocturnal dyspnea. Dermatological: No rash,  lesions/masses Respiratory: No cough, dyspnea Urologic: No hematuria, dysuria Abdominal:   No nausea, vomiting, diarrhea, bright red blood per rectum, melena, or hematemesis Neurologic:  No visual changes, wkns, changes in mental status. All other systems reviewed and are otherwise negative except as noted above.  Physical Exam    VS:  BP (!) 120/54   Pulse 69   Ht 5\' 6"  (1.676 m)   Wt 201 lb 3.2 oz (91.3 kg)   SpO2 98%   BMI 32.47 kg/m  , BMI Body mass index is 32.47 kg/m. GEN: Well nourished, well developed, in no acute distress. HEENT: normal. Neck: Supple, no JVD, carotid bruits, or masses. Cardiac: RRR, no murmurs, rubs, or gallops. No clubbing, cyanosis, left ankle nonpitting edema.  Radials/DP/PT 2+ and equal bilaterally.  Respiratory:  Respirations regular and unlabored, clear to auscultation bilaterally. GI: Soft, nontender, nondistended, BS + x 4. MS: no deformity or atrophy. Skin: warm and dry, no rash. Neuro:  Strength and sensation are intact. Psych: Normal affect.  Accessory Clinical Findings    Recent Labs: No results found for requested labs within last 365 days.   Recent Lipid Panel No results found for: "CHOL", "TRIG", "HDL", "CHOLHDL", "VLDL", "LDLCALC", "LDLDIRECT"  ECG personally reviewed by me today-sinus rhythm with premature atrial complexes low voltage QRS 69 bpm  EKG 05/27/2022  normal sinus rhythm with premature atrial complexes anteroseptal infarct undetermined age 63 bpm- No acute changes  Echocardiogram 09/20/18  -------------------------------------------------------------------  LV EF: 55% -   60%   -------------------------------------------------------------------  Indications:      Shortness of breath (R06.02).   -------------------------------------------------------------------  History:   PMH:   Dyspnea.  Risk factors:  Anemia. CKD stage 3.  Rheumatic fever. Hypertension. Diabetes mellitus. Obese.  Dyslipidemia.    -------------------------------------------------------------------  Study Conclusions   - Left ventricle: The cavity size was normal. Wall thickness was    normal. Systolic function was normal. The estimated ejection    fraction was in the range of 55% to 60%. Wall motion was normal;    there were no regional wall motion abnormalities. Doppler    parameters are consistent with abnormal left ventricular    relaxation (grade  1 diastolic dysfunction).  - Mitral valve: Mild focal calcification of the anterior leaflet.    Valve area by pressure half-time: 2.42 cm^2.   -------------------------------------------------------------------  Study data:  No prior study was available for comparison.  Study  status:  Routine.  Procedure:  The patient reported no pain pre or  post test. Transthoracic echocardiography. Image quality was  suboptimal. The study was technically difficult, as a result of  poor acoustic windows, poor sound wave transmission, and body  habitus.  Study completion:  There were no complications.  Transthoracic echocardiography.  M-mode, complete 2D, spectral  Doppler, and color Doppler.  Birthdate:  Patient birthdate:  12/17/1940.  Age:  Patient is 82 yr old.  Sex:  Gender: female.  BMI: 42.3 kg/m^2.  Blood pressure:     142/56  Patient status:  Outpatient.  Study date:  Study date: 09/20/2018. Study time: 12:30  PM.  Location:  [facility]   -------------------------------------------------------------------   -------------------------------------------------------------------  Left ventricle:  The cavity size was normal. Wall thickness was  normal. Systolic function was normal. The estimated ejection  fraction was in the range of 55% to 60%. Wall motion was normal;  there were no regional wall motion abnormalities. Doppler  parameters are consistent with abnormal left ventricular relaxation  (grade 1 diastolic dysfunction). Indeterminate ventricular filling   pressure by Doppler parameters.   -------------------------------------------------------------------  Aortic valve:  Poorly visualized.  Structurally normal valve.  Cusp separation was normal.  Doppler:  Transvalvular velocity was  within the normal range. There was no stenosis. There was no  regurgitation.    VTI ratio of LVOT to aortic valve: 0.81. Valve  area (VTI): 3.37 cm^2. Indexed valve area (VTI): 1.37 cm^2/m^2.  Peak velocity ratio of LVOT to aortic valve: 0.81. Valve area  (Vmax): 3.38 cm^2. Indexed valve area (Vmax): 1.37 cm^2/m^2. Mean  velocity ratio of LVOT to aortic valve: 0.86. Valve area (Vmean):  3.55 cm^2. Indexed valve area (Vmean): 1.44 cm^2/m^2.    Mean  gradient (S): 8 mm Hg. Peak gradient (S): 14 mm Hg.   -------------------------------------------------------------------  Mitral valve:   Mildly thickened leaflets .  Mild focal  calcification of the anterior leaflet.  Doppler:     Valve area by  pressure half-time: 2.42 cm^2. Indexed valve area by pressure  half-time: 0.98 cm^2/m^2.    Peak gradient (D): 3 mm Hg.   -------------------------------------------------------------------  Left atrium:  The atrium was normal in size.   -------------------------------------------------------------------  Right ventricle:  The cavity size was normal. Systolic function was  normal.   -------------------------------------------------------------------  Pulmonic valve:   Poorly visualized.   -------------------------------------------------------------------  Tricuspid valve:   Structurally normal valve.   Leaflet separation  was normal.  Doppler:  Transvalvular velocity was within the normal  range. There was no regurgitation.   -------------------------------------------------------------------  Pulmonary artery:    Systolic pressure could not be accurately  estimated.   -------------------------------------------------------------------  Right atrium:  The  atrium was normal in size.   -------------------------------------------------------------------  Pericardium:  There was no pericardial effusion.  Echocardiogram 06/16/2022  IMPRESSIONS     1. Left ventricular ejection fraction, by estimation, is 60 to 65%. The  left ventricle has normal function. The left ventricle has no regional  wall motion abnormalities. There is mild left ventricular hypertrophy.  Left ventricular diastolic parameters  are consistent with Grade I diastolic dysfunction (impaired relaxation).   2. Right ventricular systolic function is normal. The right ventricular  size is normal. Tricuspid regurgitation  signal is inadequate for assessing  PA pressure.   3. The mitral valve was not well visualized. No evidence of mitral valve  regurgitation. No evidence of mitral stenosis.   4. Small ascending aortic aneurysm (42mm). Aortic valve regurgitation is  not visualized.   5. The inferior vena cava is normal in size with greater than 50%  respiratory variability, suggesting right atrial pressure of 3 mmHg.   FINDINGS   Left Ventricle: Left ventricular ejection fraction, by estimation, is 60  to 65%. The left ventricle has normal function. The left ventricle has no  regional wall motion abnormalities. The left ventricular internal cavity  size was normal in size. There is   mild left ventricular hypertrophy. Left ventricular diastolic parameters  are consistent with Grade I diastolic dysfunction (impaired relaxation).   Right Ventricle: The right ventricular size is normal. Right ventricular  systolic function is normal. Tricuspid regurgitation signal is inadequate  for assessing PA pressure.   Left Atrium: Left atrial size was normal in size.   Right Atrium: Right atrial size was normal in size.   Pericardium: There is no evidence of pericardial effusion.   Mitral Valve: The mitral valve was not well visualized. No evidence of  mitral valve regurgitation. No  evidence of mitral valve stenosis.   Tricuspid Valve: The tricuspid valve is normal in structure. Tricuspid  valve regurgitation is not demonstrated.   Aortic Valve: Small ascending aortic aneurysm (42mm). Aortic valve  regurgitation is not visualized. Aortic valve mean gradient measures 6.7  mmHg. Aortic valve peak gradient measures 12.3 mmHg. Aortic valve area, by  VTI measures 2.12 cm.   Pulmonic Valve: The pulmonic valve was not well visualized. Pulmonic valve  regurgitation is not visualized.   Venous: The inferior vena cava is normal in size with greater than 50%  respiratory variability, suggesting right atrial pressure of 3 mmHg.   IAS/Shunts: No atrial level shunt detected by color flow Doppler.   Assessment & Plan   1.  DOE-stable.  Weight improving. Somewhat active (Doing chair ex) and continues to lose weight.  Prior work-up by pulmonology who question pulmonary hypertension.   Heart healthy low-sodium diet Maintain PA Continue weight loss Continue to monitor  Coronary artery calcification-denies chest pain.  Not a good candidate for nuclear stress testing due to CKD.  Noted to have multivessel coronary disease on chest CT. Continue aspirin, amlodipine Heart healthy low-sodium diet Increase physical activity as tolerated  Ascending aortic aneurysm-denies recent episodes of back and chest discomfort.  Echocardiogram showed 42 mm ascending aortic aneurysm on last check.  Details above. Maintain good blood pressure control Repeat echocardiogram  Hyperlipidemia-LDL 45 05/17/22 Continue rosuvastatin, aspirin High-fiber diet Increase physical activity as tolerated  Essential hypertension-BP today 120/54.   Maintain blood pressure log Continue lisinopril, amlodipine   Moderate obesity-weight today 201.2 lbs has lost around another 10 lbs. Continue weight loss Increase physical activity as tolerated  CKD stage III-creatinine 1.460 on 05/17/23 Follows with  PCP Requested recent lab work  Disposition: Follow-up with Dr. Rennis Golden or me in 12 months.    Thomasene Ripple. Treyven Lafauci NP-C    06/01/2023, 10:59 AM [facility] [address] Office [phone] Fax [phone]  Notice: This dictation was prepared with Dragon dictation along with smaller phrase technology. Any transcriptional errors that result from this process are unintentional and may not be corrected upon review.  I spent 13 minutes examining this patient, reviewing medications, and using patient centered shared  decision making involving her cardiac care.  Prior to her visit I spent greater than 20 minutes reviewing her past medical history,  medications, and prior cardiac tests.

## 2023-05-30 DIAGNOSIS — H903 Sensorineural hearing loss, bilateral: Secondary | ICD-10-CM | POA: Diagnosis not present

## 2023-06-01 ENCOUNTER — Ambulatory Visit: Payer: Medicare Other | Attending: General Practice | Admitting: General Practice

## 2023-06-01 ENCOUNTER — Encounter: Payer: Self-pay | Admitting: General Practice

## 2023-06-01 VITALS — BP 120/54 | HR 69 | Ht 66.0 in | Wt 201.2 lb

## 2023-06-01 DIAGNOSIS — I10 Essential (primary) hypertension: Secondary | ICD-10-CM | POA: Diagnosis not present

## 2023-06-01 DIAGNOSIS — N183 Chronic kidney disease, stage 3 unspecified: Secondary | ICD-10-CM | POA: Diagnosis not present

## 2023-06-01 DIAGNOSIS — I7121 Aneurysm of the ascending aorta, without rupture: Secondary | ICD-10-CM

## 2023-06-01 DIAGNOSIS — R0609 Other forms of dyspnea: Secondary | ICD-10-CM | POA: Diagnosis not present

## 2023-06-01 DIAGNOSIS — I2584 Coronary atherosclerosis due to calcified coronary lesion: Secondary | ICD-10-CM

## 2023-06-01 DIAGNOSIS — I2510 Atherosclerotic heart disease of native coronary artery without angina pectoris: Secondary | ICD-10-CM

## 2023-06-01 NOTE — Patient Instructions (Addendum)
Medication Instructions:  The current medical regimen is effective;  continue present plan and medications as directed. Please refer to the Current Medication list given to you today.  *If you need a refill on your cardiac medications before your next appointment, please call your pharmacy*  Testing Echocardiogram - Your physician has requested that you have an echocardiogram. Echocardiography is a painless test that uses sound waves to create images of your heart. It provides your doctor with information about the size and shape of your heart and how well your heart's chambers and valves are working. This procedure takes approximately one hour. There are no restrictions for this procedure.    Lab Work: NONE ordered at this time of appointment    Other Instructions TAKE AND LOG YOUR BLOOD PRESSURE   Follow-Up: At [facility], you and your health needs are our priority.  As part of our continuing mission to provide you with exceptional heart care, we have created designated Provider Care Teams.  These Care Teams include your primary Cardiologist (physician) and Advanced Practice Providers (APPs -  Physician Assistants and Nurse Practitioners) who all work together to provide you with the care you need, when you need it.  Your next appointment:   12 month(s)  Provider:   Chrystie Nose, MD

## 2023-06-14 DIAGNOSIS — H401131 Primary open-angle glaucoma, bilateral, mild stage: Secondary | ICD-10-CM | POA: Diagnosis not present

## 2023-06-30 ENCOUNTER — Ambulatory Visit (HOSPITAL_COMMUNITY): Payer: Medicare Other | Attending: Cardiovascular Disease

## 2023-06-30 DIAGNOSIS — I7121 Aneurysm of the ascending aorta, without rupture: Secondary | ICD-10-CM | POA: Diagnosis not present

## 2023-06-30 LAB — ECHOCARDIOGRAM COMPLETE
Area-P 1/2: 2.2 cm2
Calc EF: 58 %
S' Lateral: 2.9 cm
Single Plane A2C EF: 56.1 %
Single Plane A4C EF: 59.9 %

## 2023-07-03 DIAGNOSIS — Z0000 Encounter for general adult medical examination without abnormal findings: Secondary | ICD-10-CM | POA: Diagnosis not present

## 2023-08-21 DIAGNOSIS — E559 Vitamin D deficiency, unspecified: Secondary | ICD-10-CM | POA: Diagnosis not present

## 2023-08-21 DIAGNOSIS — I2510 Atherosclerotic heart disease of native coronary artery without angina pectoris: Secondary | ICD-10-CM | POA: Diagnosis not present

## 2023-08-21 DIAGNOSIS — E1165 Type 2 diabetes mellitus with hyperglycemia: Secondary | ICD-10-CM | POA: Diagnosis not present

## 2023-08-21 DIAGNOSIS — R801 Persistent proteinuria, unspecified: Secondary | ICD-10-CM | POA: Diagnosis not present

## 2023-08-21 DIAGNOSIS — N1832 Chronic kidney disease, stage 3b: Secondary | ICD-10-CM | POA: Diagnosis not present

## 2023-08-24 DIAGNOSIS — N1832 Chronic kidney disease, stage 3b: Secondary | ICD-10-CM | POA: Diagnosis not present

## 2023-08-24 DIAGNOSIS — I7121 Aneurysm of the ascending aorta, without rupture: Secondary | ICD-10-CM | POA: Diagnosis not present

## 2023-08-24 DIAGNOSIS — I10 Essential (primary) hypertension: Secondary | ICD-10-CM | POA: Diagnosis not present

## 2023-08-24 DIAGNOSIS — Z23 Encounter for immunization: Secondary | ICD-10-CM | POA: Diagnosis not present

## 2023-08-24 DIAGNOSIS — E1122 Type 2 diabetes mellitus with diabetic chronic kidney disease: Secondary | ICD-10-CM | POA: Diagnosis not present

## 2023-08-24 DIAGNOSIS — E039 Hypothyroidism, unspecified: Secondary | ICD-10-CM | POA: Diagnosis not present

## 2023-08-24 DIAGNOSIS — E559 Vitamin D deficiency, unspecified: Secondary | ICD-10-CM | POA: Diagnosis not present

## 2023-08-24 DIAGNOSIS — M85852 Other specified disorders of bone density and structure, left thigh: Secondary | ICD-10-CM | POA: Diagnosis not present

## 2023-10-03 DIAGNOSIS — H02884 Meibomian gland dysfunction left upper eyelid: Secondary | ICD-10-CM | POA: Diagnosis not present

## 2023-10-03 DIAGNOSIS — H401131 Primary open-angle glaucoma, bilateral, mild stage: Secondary | ICD-10-CM | POA: Diagnosis not present

## 2023-11-23 DIAGNOSIS — I10 Essential (primary) hypertension: Secondary | ICD-10-CM | POA: Diagnosis not present

## 2023-11-23 DIAGNOSIS — E039 Hypothyroidism, unspecified: Secondary | ICD-10-CM | POA: Diagnosis not present

## 2023-11-23 DIAGNOSIS — R252 Cramp and spasm: Secondary | ICD-10-CM | POA: Diagnosis not present

## 2023-11-23 DIAGNOSIS — E1122 Type 2 diabetes mellitus with diabetic chronic kidney disease: Secondary | ICD-10-CM | POA: Diagnosis not present

## 2023-11-23 DIAGNOSIS — N1832 Chronic kidney disease, stage 3b: Secondary | ICD-10-CM | POA: Diagnosis not present

## 2024-01-06 ENCOUNTER — Encounter (HOSPITAL_COMMUNITY): Payer: Self-pay

## 2024-01-06 ENCOUNTER — Emergency Department (HOSPITAL_COMMUNITY)
Admission: EM | Admit: 2024-01-06 | Discharge: 2024-01-06 | Disposition: A | Discharge status: Home / Self Care | Payer: Medicare Other | Attending: Emergency Medicine | Admitting: Emergency Medicine

## 2024-01-06 ENCOUNTER — Emergency Department (HOSPITAL_BASED_OUTPATIENT_CLINIC_OR_DEPARTMENT_OTHER): Payer: Medicare Other

## 2024-01-06 ENCOUNTER — Other Ambulatory Visit: Payer: Self-pay

## 2024-01-06 DIAGNOSIS — I129 Hypertensive chronic kidney disease with stage 1 through stage 4 chronic kidney disease, or unspecified chronic kidney disease: Secondary | ICD-10-CM | POA: Insufficient documentation

## 2024-01-06 DIAGNOSIS — Z79899 Other long term (current) drug therapy: Secondary | ICD-10-CM | POA: Diagnosis not present

## 2024-01-06 DIAGNOSIS — E1122 Type 2 diabetes mellitus with diabetic chronic kidney disease: Secondary | ICD-10-CM | POA: Diagnosis not present

## 2024-01-06 DIAGNOSIS — R2242 Localized swelling, mass and lump, left lower limb: Secondary | ICD-10-CM | POA: Diagnosis not present

## 2024-01-06 DIAGNOSIS — E039 Hypothyroidism, unspecified: Secondary | ICD-10-CM | POA: Insufficient documentation

## 2024-01-06 DIAGNOSIS — M7989 Other specified soft tissue disorders: Secondary | ICD-10-CM | POA: Insufficient documentation

## 2024-01-06 DIAGNOSIS — N189 Chronic kidney disease, unspecified: Secondary | ICD-10-CM | POA: Diagnosis not present

## 2024-01-06 DIAGNOSIS — Z7982 Long term (current) use of aspirin: Secondary | ICD-10-CM | POA: Insufficient documentation

## 2024-01-06 DIAGNOSIS — L03818 Cellulitis of other sites: Secondary | ICD-10-CM | POA: Diagnosis not present

## 2024-01-06 DIAGNOSIS — M79662 Pain in left lower leg: Secondary | ICD-10-CM | POA: Diagnosis not present

## 2024-01-06 DIAGNOSIS — Z7984 Long term (current) use of oral hypoglycemic drugs: Secondary | ICD-10-CM | POA: Insufficient documentation

## 2024-01-06 LAB — CBC
HCT: 36.3 % (ref 36.0–46.0)
Hemoglobin: 12.2 g/dL (ref 12.0–15.0)
MCH: 30.2 pg (ref 26.0–34.0)
MCHC: 33.6 g/dL (ref 30.0–36.0)
MCV: 89.9 fL (ref 80.0–100.0)
Platelets: 206 10*3/uL (ref 150–400)
RBC: 4.04 MIL/uL (ref 3.87–5.11)
RDW: 13 % (ref 11.5–15.5)
WBC: 5.9 10*3/uL (ref 4.0–10.5)
nRBC: 0 % (ref 0.0–0.2)

## 2024-01-06 LAB — COMPREHENSIVE METABOLIC PANEL
ALT: 23 U/L (ref 0–44)
AST: 25 U/L (ref 15–41)
Albumin: 4 g/dL (ref 3.5–5.0)
Alkaline Phosphatase: 56 U/L (ref 38–126)
Anion gap: 11 (ref 5–15)
BUN: 24 mg/dL — ABNORMAL HIGH (ref 8–23)
CO2: 21 mmol/L — ABNORMAL LOW (ref 22–32)
Calcium: 9.7 mg/dL (ref 8.9–10.3)
Chloride: 104 mmol/L (ref 98–111)
Creatinine, Ser: 1.25 mg/dL — ABNORMAL HIGH (ref 0.44–1.00)
GFR, Estimated: 43 mL/min — ABNORMAL LOW (ref >60)
Glucose, Bld: 150 mg/dL — ABNORMAL HIGH (ref 70–99)
Potassium: 4.2 mmol/L (ref 3.5–5.1)
Sodium: 136 mmol/L (ref 135–145)
Total Bilirubin: 0.9 mg/dL (ref 0.0–1.2)
Total Protein: 7.2 g/dL (ref 6.5–8.1)

## 2024-01-06 NOTE — Progress Notes (Signed)
VASCULAR LAB    Left lower extremity venous duplex has been performed.  See CV proc for preliminary results.   Messaged results to Dr. Anitra Lauth via secure chat  Sherren Kerns, RVT 01/06/2024, 8:06 PM

## 2024-01-06 NOTE — ED Notes (Addendum)
Vascular US is at bedside.

## 2024-01-06 NOTE — Discharge Instructions (Signed)
No signs of infection or blood clot in your leg today.  It could be related to poor circulation meaning the veins are having a hard time getting the blood back up to your heart.  Continue using the Ace wrap or try a compression sock and elevating the leg.  Follow-up with your regular doctor for recheck.  If this does not help you may need to see a specialist.

## 2024-01-06 NOTE — ED Triage Notes (Signed)
Pt c.o left leg swelling x3 weeks, denies pain just a tightness feeling. Redness noted to her shin, denies fever/chills.

## 2024-01-06 NOTE — ED Provider Triage Note (Signed)
Emergency Medicine Provider Triage Evaluation Note  Claire Blake , a 83 y.o. female  was evaluated in triage.  Pt complains of left leg pain and swelling.  States same began 3 weeks ago has been persistent since then.  No history of similar.  Notes redness as well.  No fevers.  No shortness of breath.  No recent travel or surgeries.  No history of blood clots.  Review of Systems  Positive:  Negative:   Physical Exam  BP (!) 159/57 (BP Location: Right Arm)   Pulse 84   Temp 97.8 F (36.6 C) (Oral)   Resp 18   Ht 5\' 7"  (1.702 m)   Wt 83.9 kg   SpO2 98%   BMI 28.98 kg/m  Gen:   Awake, no distress   Resp:  Normal effort  MSK:   Moves extremities without difficulty  Other:  Erythema and swelling noted to the left lower leg. No purulence. Dp and pt pulses palpable  Medical Decision Making  Medically screening exam initiated at 4:31 PM.  Appropriate orders placed.  Claire Blake was informed that the remainder of the evaluation will be completed by another provider, this initial triage assessment does not replace that evaluation, and the importance of remaining in the ED until their evaluation is complete.     Silva Bandy, PA-C 01/06/24 [phone]

## 2024-01-06 NOTE — ED Provider Notes (Signed)
[place] [facility]ERGENCY DEPARTMENT AT [facility] Provider Note   CSN: [id number] Arrival date & time: 01/06/24  1520     History  Chief Complaint  Patient presents with   Leg Swelling    Claire Blake is a 83 y.o. female.  Patient is an 83 year old female with a history of AAA, hypertension, hypothyroidism, CKD, diabetes who is presenting today with a 3-week history of persistent left lower leg swelling which waxes and wanes in severity.  She reports that that is her weak leg from a prior surgery and injury and it is always a little bit smaller until the last 3 weeks.  She reports sometimes it gets so tight it feels like it is going to pop.  She is also noticed some mild redness in the left lower leg.  She denies any thigh pain, fevers, chest pain or shortness of breath.  No recent immobilization or surgeries on the leg.  She denies any prior history of blood clots.  The history is provided by the patient.       Home Medications Prior to Admission medications   Medication Sig Start Date End Date Taking? Authorizing Provider  amLODipine (NORVASC) 2.5 MG tablet Take 7.5 mg by mouth daily. 04/10/23   [provider]  amLODipine (NORVASC) 5 MG tablet Take 5 mg by mouth daily. Patient not taking: Reported on 06/01/2023    [provider]  aspirin EC 81 MG tablet Take 81 mg by mouth every evening.     [provider]  Blood Glucose Monitoring Suppl (ONE TOUCH ULTRA SYSTEM KIT) W/DEVICE KIT 1 kit by Does not apply route once.    [provider]  calcium carbonate (CALCIUM 600) 600 MG TABS tablet Take 600 mg by mouth 3 (three) times a week.    [provider]  Cholecalciferol (VITAMIN D3) 50 MCG (2000 UT) TABS Take 1 tablet by mouth every other day.    [provider]  Empagliflozin-metFORMIN HCl ER (SYNJARDY XR) 25-1000 MG TB24 Take 1 tablet by mouth in the morning.    [provider]  glipiZIDE (GLUCOTROL) 10 MG tablet  Take 10 mg by mouth 2 (two) times daily before a meal.    [provider]  latanoprost (XALATAN) 0.005 % ophthalmic solution Place 1 drop into both eyes at bedtime.    [provider]  levothyroxine (SYNTHROID, LEVOTHROID) 88 MCG tablet Take 44-88 mcg by mouth See admin instructions. Take 0.5 tablet (44 mcg) by mouth on Saturdays, then take 1 tablet (88 mcg) by mouth on all other days in the morning before breakfast.    [provider]  lisinopril (PRINIVIL,ZESTRIL) 40 MG tablet Take 40 mg by mouth every evening.     [provider]  metFORMIN (GLUCOPHAGE) 500 MG tablet Take 1,000 mg by mouth daily with breakfast.    [provider]  [facility] VERIO test strip daily.    [provider]  rosuvastatin (CRESTOR) 20 MG tablet Take 20 mg by mouth every evening.  07/03/18   [provider]  Semaglutide (OZEMPIC, 0.25 OR 0.5 MG/DOSE, [place]) Inject 0.5 mg into the skin. 1 times weekly    [provider]      Allergies    Zocor [simvastatin]    Review of Systems   Review of Systems  Physical Exam Updated Vital Signs BP 135/64   Pulse 67   Temp 97.9 F (36.6 C) (Oral)   Resp 19   Ht 5\' 7"  (  1.702 m)   Wt 83.9 kg   SpO2 97%   BMI 28.98 kg/m  Physical Exam Vitals and nursing note reviewed.  Constitutional:      General: She is not in acute distress.    Appearance: She is well-developed.  HENT:     Head: Normocephalic and atraumatic.  Eyes:     Pupils: Pupils are equal, round, and reactive to light.  Cardiovascular:     Rate and Rhythm: Normal rate and regular rhythm.     Pulses: Normal pulses.     Heart sounds: Normal heart sounds. No murmur heard.    No friction rub.  Pulmonary:     Effort: Pulmonary effort is normal.     Breath sounds: Normal breath sounds. No wheezing or rales.  Abdominal:     General: Bowel sounds are normal. There is no distension.     Palpations: Abdomen is soft.     Tenderness: There is no  abdominal tenderness. There is no guarding or rebound.  Musculoskeletal:        General: No tenderness. Normal range of motion.     Left lower leg: Edema present.     Comments: Left calf is swollen compared to the right with nonpitting edema.  Mild erythema but no warmth with palpation.  No draining or lesions noted.  Capillary refill is less than 3 seconds.  2+ DP pulse on the left and multiple well-healed surgical scars on the left with some muscle wasting in the left thigh  Skin:    General: Skin is warm and dry.     Findings: No rash.  Neurological:     Mental Status: She is alert and oriented to person, place, and time.     Cranial Nerves: No cranial nerve deficit.  Psychiatric:        Behavior: Behavior normal.     ED Results / Procedures / Treatments   Labs (all labs ordered are listed, but only abnormal results are displayed) Labs Reviewed  COMPREHENSIVE METABOLIC PANEL - Abnormal; Notable for the following components:      Result Value   CO2 21 (*)    Glucose, Bld 150 (*)    BUN 24 (*)    Creatinine, Ser 1.25 (*)    GFR, Estimated 43 (*)    All other components within normal limits  CBC    EKG None  Radiology No results found.  Procedures Procedures    Medications Ordered in ED Medications - No data to display  ED Course/ Medical Decision Making/ A&P                                 Medical Decision Making Amount and/or Complexity of Data Reviewed Labs: ordered. Decision-making details documented in ED Course. Radiology: ordered and independent interpretation performed. Decision-making details documented in ED Course.   Pt with multiple medical problems and comorbidities and presenting today with a complaint that caries a high risk for morbidity and mortality.  Here today with complaints of leg swelling concerning for DVT.  Patient's pulses intact and low suspicion for arterial insufficiency.  She has no swelling in the left leg or pain in her thigh.  No  symptoms to suggest PE.  I independently interpreted patient's labs and CBC within normal limits, CMP with stable creatinine of 1.25 and low suspicion that that would be causing patient's symptoms of swelling.  There is no evidence of cellulitis  or abscess today.  Also patient has had no medication changes that would cause the swelling. Duplex is pending. 8:37 PM [place] is neg for dvt.  May be related to venous stasis with her prior hx of surgery.  Will have pt wear compression sock and f/u with pcp.  No signs of cellulitis at this time.        Final Clinical Impression(s) / ED Diagnoses Final diagnoses:  Left leg swelling    Rx / DC Orders ED Discharge Orders     None         Gwyneth Sprout, MD 01/06/24 2037

## 2024-01-11 DIAGNOSIS — R600 Localized edema: Secondary | ICD-10-CM | POA: Diagnosis not present

## 2024-01-12 DIAGNOSIS — H40013 Open angle with borderline findings, low risk, bilateral: Secondary | ICD-10-CM | POA: Diagnosis not present

## 2024-01-12 DIAGNOSIS — H40053 Ocular hypertension, bilateral: Secondary | ICD-10-CM | POA: Diagnosis not present

## 2024-02-05 DIAGNOSIS — H40053 Ocular hypertension, bilateral: Secondary | ICD-10-CM | POA: Diagnosis not present

## 2024-02-05 DIAGNOSIS — H40013 Open angle with borderline findings, low risk, bilateral: Secondary | ICD-10-CM | POA: Diagnosis not present

## 2024-03-06 DIAGNOSIS — I2510 Atherosclerotic heart disease of native coronary artery without angina pectoris: Secondary | ICD-10-CM | POA: Diagnosis not present

## 2024-03-06 DIAGNOSIS — E039 Hypothyroidism, unspecified: Secondary | ICD-10-CM | POA: Diagnosis not present

## 2024-03-06 DIAGNOSIS — M85852 Other specified disorders of bone density and structure, left thigh: Secondary | ICD-10-CM | POA: Diagnosis not present

## 2024-03-06 DIAGNOSIS — E1122 Type 2 diabetes mellitus with diabetic chronic kidney disease: Secondary | ICD-10-CM | POA: Diagnosis not present

## 2024-03-06 DIAGNOSIS — I10 Essential (primary) hypertension: Secondary | ICD-10-CM | POA: Diagnosis not present

## 2024-03-06 DIAGNOSIS — Z79899 Other long term (current) drug therapy: Secondary | ICD-10-CM | POA: Diagnosis not present

## 2024-03-06 DIAGNOSIS — N1832 Chronic kidney disease, stage 3b: Secondary | ICD-10-CM | POA: Diagnosis not present

## 2024-03-06 DIAGNOSIS — E782 Mixed hyperlipidemia: Secondary | ICD-10-CM | POA: Diagnosis not present

## 2024-06-03 DIAGNOSIS — E039 Hypothyroidism, unspecified: Secondary | ICD-10-CM | POA: Diagnosis not present

## 2024-06-03 DIAGNOSIS — E1165 Type 2 diabetes mellitus with hyperglycemia: Secondary | ICD-10-CM | POA: Diagnosis not present

## 2024-06-03 DIAGNOSIS — I2510 Atherosclerotic heart disease of native coronary artery without angina pectoris: Secondary | ICD-10-CM | POA: Diagnosis not present

## 2024-06-03 DIAGNOSIS — N189 Chronic kidney disease, unspecified: Secondary | ICD-10-CM | POA: Diagnosis not present

## 2024-06-11 DIAGNOSIS — E039 Hypothyroidism, unspecified: Secondary | ICD-10-CM | POA: Diagnosis not present

## 2024-06-11 DIAGNOSIS — E1122 Type 2 diabetes mellitus with diabetic chronic kidney disease: Secondary | ICD-10-CM | POA: Diagnosis not present

## 2024-06-11 DIAGNOSIS — E782 Mixed hyperlipidemia: Secondary | ICD-10-CM | POA: Diagnosis not present

## 2024-06-11 DIAGNOSIS — N1832 Chronic kidney disease, stage 3b: Secondary | ICD-10-CM | POA: Diagnosis not present

## 2024-07-04 DIAGNOSIS — E1165 Type 2 diabetes mellitus with hyperglycemia: Secondary | ICD-10-CM | POA: Diagnosis not present

## 2024-07-04 DIAGNOSIS — N189 Chronic kidney disease, unspecified: Secondary | ICD-10-CM | POA: Diagnosis not present

## 2024-07-04 DIAGNOSIS — E039 Hypothyroidism, unspecified: Secondary | ICD-10-CM | POA: Diagnosis not present

## 2024-07-04 DIAGNOSIS — I2510 Atherosclerotic heart disease of native coronary artery without angina pectoris: Secondary | ICD-10-CM | POA: Diagnosis not present

## 2024-07-08 DIAGNOSIS — E039 Hypothyroidism, unspecified: Secondary | ICD-10-CM | POA: Diagnosis not present

## 2024-07-08 DIAGNOSIS — M85852 Other specified disorders of bone density and structure, left thigh: Secondary | ICD-10-CM | POA: Diagnosis not present

## 2024-07-08 DIAGNOSIS — E1165 Type 2 diabetes mellitus with hyperglycemia: Secondary | ICD-10-CM | POA: Diagnosis not present

## 2024-07-08 DIAGNOSIS — I2510 Atherosclerotic heart disease of native coronary artery without angina pectoris: Secondary | ICD-10-CM | POA: Diagnosis not present

## 2024-07-08 DIAGNOSIS — E782 Mixed hyperlipidemia: Secondary | ICD-10-CM | POA: Diagnosis not present

## 2024-07-08 DIAGNOSIS — R5383 Other fatigue: Secondary | ICD-10-CM | POA: Diagnosis not present

## 2024-07-08 DIAGNOSIS — E1122 Type 2 diabetes mellitus with diabetic chronic kidney disease: Secondary | ICD-10-CM | POA: Diagnosis not present

## 2024-07-08 DIAGNOSIS — I10 Essential (primary) hypertension: Secondary | ICD-10-CM | POA: Diagnosis not present

## 2024-07-08 DIAGNOSIS — Z79899 Other long term (current) drug therapy: Secondary | ICD-10-CM | POA: Diagnosis not present

## 2024-07-09 ENCOUNTER — Other Ambulatory Visit: Payer: Self-pay | Admitting: Medical Genetics

## 2024-07-11 DIAGNOSIS — N1832 Chronic kidney disease, stage 3b: Secondary | ICD-10-CM | POA: Diagnosis not present

## 2024-07-11 DIAGNOSIS — E1122 Type 2 diabetes mellitus with diabetic chronic kidney disease: Secondary | ICD-10-CM | POA: Diagnosis not present

## 2024-07-23 DIAGNOSIS — Z0000 Encounter for general adult medical examination without abnormal findings: Secondary | ICD-10-CM | POA: Diagnosis not present

## 2024-07-29 DIAGNOSIS — N1832 Chronic kidney disease, stage 3b: Secondary | ICD-10-CM | POA: Diagnosis not present

## 2024-07-29 DIAGNOSIS — I2510 Atherosclerotic heart disease of native coronary artery without angina pectoris: Secondary | ICD-10-CM | POA: Diagnosis not present

## 2024-07-29 DIAGNOSIS — I10 Essential (primary) hypertension: Secondary | ICD-10-CM | POA: Diagnosis not present

## 2024-07-29 DIAGNOSIS — E1122 Type 2 diabetes mellitus with diabetic chronic kidney disease: Secondary | ICD-10-CM | POA: Diagnosis not present

## 2024-08-04 DIAGNOSIS — N189 Chronic kidney disease, unspecified: Secondary | ICD-10-CM | POA: Diagnosis not present

## 2024-08-04 DIAGNOSIS — E1165 Type 2 diabetes mellitus with hyperglycemia: Secondary | ICD-10-CM | POA: Diagnosis not present

## 2024-08-04 DIAGNOSIS — I2510 Atherosclerotic heart disease of native coronary artery without angina pectoris: Secondary | ICD-10-CM | POA: Diagnosis not present

## 2024-08-04 DIAGNOSIS — E039 Hypothyroidism, unspecified: Secondary | ICD-10-CM | POA: Diagnosis not present

## 2024-09-03 DIAGNOSIS — N189 Chronic kidney disease, unspecified: Secondary | ICD-10-CM | POA: Diagnosis not present

## 2024-09-03 DIAGNOSIS — E1165 Type 2 diabetes mellitus with hyperglycemia: Secondary | ICD-10-CM | POA: Diagnosis not present

## 2024-09-03 DIAGNOSIS — E039 Hypothyroidism, unspecified: Secondary | ICD-10-CM | POA: Diagnosis not present

## 2024-09-03 DIAGNOSIS — I2510 Atherosclerotic heart disease of native coronary artery without angina pectoris: Secondary | ICD-10-CM | POA: Diagnosis not present

## 2024-09-24 ENCOUNTER — Other Ambulatory Visit: Payer: Self-pay | Admitting: Medical Genetics

## 2024-09-24 DIAGNOSIS — Z006 Encounter for examination for normal comparison and control in clinical research program: Secondary | ICD-10-CM

## 2024-11-26 DIAGNOSIS — I7121 Aneurysm of the ascending aorta, without rupture: Secondary | ICD-10-CM

## 2024-12-25 ENCOUNTER — Ambulatory Visit (HOSPITAL_COMMUNITY)
Admission: RE | Admit: 2024-12-25 | Discharge: 2024-12-25 | Disposition: A | Discharge status: Home / Self Care | Source: Ambulatory Visit | Attending: Surgery | Admitting: Surgery

## 2024-12-25 DIAGNOSIS — I7121 Aneurysm of the ascending aorta, without rupture: Secondary | ICD-10-CM | POA: Insufficient documentation

## 2024-12-25 LAB — ECHOCARDIOGRAM COMPLETE
AR max vel: 2.8 cm2
AV Area VTI: 3.66 cm2
AV Area mean vel: 2.59 cm2
AV Mean grad: 2 mmHg
AV Peak grad: 4.2 mmHg
Ao pk vel: 1.03 m/s
Area-P 1/2: 2.83 cm2
MV M vel: 0.84 m/s
MV Peak grad: 2.8 mmHg
S' Lateral: 2.54 cm

## 2025-01-02 ENCOUNTER — Encounter (HOSPITAL_COMMUNITY): Payer: Self-pay | Admitting: Family Medicine

## 2025-01-02 DIAGNOSIS — I7121 Aneurysm of the ascending aorta, without rupture: Secondary | ICD-10-CM
